# Patient Record
Sex: Male | Born: 1935
Health system: Southern US, Community
[De-identification: ages and names within clinical notes are randomized; demographics above are authoritative.]

---

## 1997-10-19 ENCOUNTER — Inpatient Hospital Stay (HOSPITAL_COMMUNITY): Admission: EM | Admit: 1997-10-19 | Discharge: 1997-10-20 | Payer: Self-pay | Admitting: Cardiology

## 2007-09-16 ENCOUNTER — Ambulatory Visit (HOSPITAL_COMMUNITY): Admission: RE | Admit: 2007-09-16 | Discharge: 2007-09-16 | Payer: Self-pay | Admitting: Optometry

## 2009-10-22 IMAGING — US IR IVC FILTER PLACEMENT
1 series · 1 of 1 positions shown · non-contrast
Comparison: none

CLINICAL DATA: DVT despite adequate anticoagulation therapy.
Chronic renal insufficiency.

[Series 1: ir ivc filter placement · 1 of 1 slices shown]
[im 1/1]
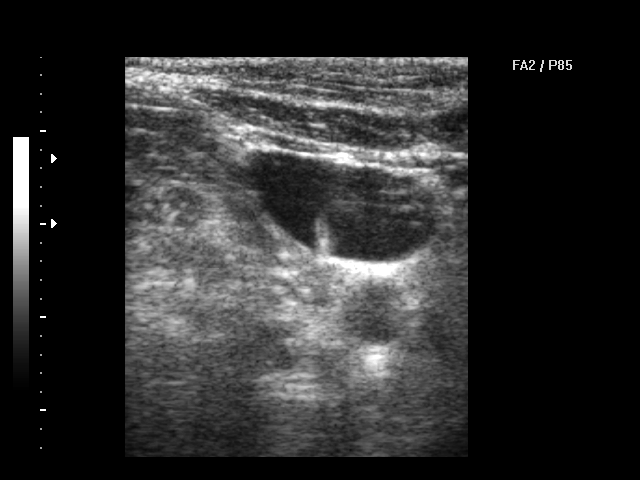

[1 of 1 positions shown; findings below may reference images not displayed]

INFERIOR VENACAVOGRAM
IVC FILTER PLACEMENT UNDER FLUOROSCOPY

Patency of the right IJ vein was confirmed with ultrasound. An
appropriate skin entry site was determined. Skin site was marked,
prepped with Betadine, and draped in usual sterile fashion, and
infiltrated locally with 1% lidocaine. Intravenous fentanyl and
Versed were administered as conscious sedation during continuous
cardiorespiratory monitoring by the radiology RN. The right IJ vein
was accessed with a 21 gauge micropuncture needle under real-time
ultrasound guidance. The needle was exchanged over a 018 guidewire
for a transitional dilator, which allow advancement of the Benson
wire into the IVC. A long 6 French vascular sheath was placed for
inferior venacavography using CO2, to avoid the potential
nephrotoxicity of iodinated contrast. This demonstrated no caval
thrombus. Renal vein inflows were evident.

The G2 IVC filter was advanced through the sheath and successfully
deployed under fluoroscopy at the L2-3 level. Followup cavagram
demonstrates stable filter position  and no evident complication.
The sheath was removed and hemostasis achieved at the site. No
immediate complication.
IMPRESSION: 1.  Normal IVC. No thrombus or significant anatomic variation.
2.  Technically successful infrarenal IVC filter placement. This is
a retrievable model.

## 2012-06-14 DIAGNOSIS — I4891 Unspecified atrial fibrillation: Secondary | ICD-10-CM

## 2015-01-15 ENCOUNTER — Ambulatory Visit (INDEPENDENT_AMBULATORY_CARE_PROVIDER_SITE_OTHER): Payer: Medicare Other | Admitting: Urology

## 2015-01-15 DIAGNOSIS — R3915 Urgency of urination: Secondary | ICD-10-CM

## 2015-01-15 DIAGNOSIS — N401 Enlarged prostate with lower urinary tract symptoms: Secondary | ICD-10-CM | POA: Diagnosis not present

## 2015-01-29 DIAGNOSIS — N183 Chronic kidney disease, stage 3 (moderate): Secondary | ICD-10-CM | POA: Diagnosis not present

## 2015-01-29 DIAGNOSIS — Z6841 Body Mass Index (BMI) 40.0 and over, adult: Secondary | ICD-10-CM | POA: Diagnosis not present

## 2015-01-29 DIAGNOSIS — Z87891 Personal history of nicotine dependence: Secondary | ICD-10-CM | POA: Diagnosis not present

## 2015-01-29 DIAGNOSIS — E1122 Type 2 diabetes mellitus with diabetic chronic kidney disease: Secondary | ICD-10-CM | POA: Diagnosis not present

## 2015-02-16 DIAGNOSIS — C44629 Squamous cell carcinoma of skin of left upper limb, including shoulder: Secondary | ICD-10-CM | POA: Diagnosis not present

## 2015-02-16 DIAGNOSIS — D485 Neoplasm of uncertain behavior of skin: Secondary | ICD-10-CM | POA: Diagnosis not present

## 2015-02-16 DIAGNOSIS — E559 Vitamin D deficiency, unspecified: Secondary | ICD-10-CM | POA: Diagnosis not present

## 2015-02-16 DIAGNOSIS — I10 Essential (primary) hypertension: Secondary | ICD-10-CM | POA: Diagnosis not present

## 2015-02-16 DIAGNOSIS — L820 Inflamed seborrheic keratosis: Secondary | ICD-10-CM | POA: Diagnosis not present

## 2015-02-16 DIAGNOSIS — L821 Other seborrheic keratosis: Secondary | ICD-10-CM | POA: Diagnosis not present

## 2015-02-16 DIAGNOSIS — N183 Chronic kidney disease, stage 3 (moderate): Secondary | ICD-10-CM | POA: Diagnosis not present

## 2015-02-16 DIAGNOSIS — D509 Iron deficiency anemia, unspecified: Secondary | ICD-10-CM | POA: Diagnosis not present

## 2015-02-16 DIAGNOSIS — Z79899 Other long term (current) drug therapy: Secondary | ICD-10-CM | POA: Diagnosis not present

## 2015-02-16 DIAGNOSIS — R809 Proteinuria, unspecified: Secondary | ICD-10-CM | POA: Diagnosis not present

## 2015-02-16 DIAGNOSIS — L570 Actinic keratosis: Secondary | ICD-10-CM | POA: Diagnosis not present

## 2015-02-23 DIAGNOSIS — I509 Heart failure, unspecified: Secondary | ICD-10-CM | POA: Diagnosis not present

## 2015-02-23 DIAGNOSIS — D649 Anemia, unspecified: Secondary | ICD-10-CM | POA: Diagnosis not present

## 2015-02-23 DIAGNOSIS — J449 Chronic obstructive pulmonary disease, unspecified: Secondary | ICD-10-CM | POA: Diagnosis not present

## 2015-02-23 DIAGNOSIS — E1129 Type 2 diabetes mellitus with other diabetic kidney complication: Secondary | ICD-10-CM | POA: Diagnosis not present

## 2015-02-23 DIAGNOSIS — N184 Chronic kidney disease, stage 4 (severe): Secondary | ICD-10-CM | POA: Diagnosis not present

## 2015-02-23 DIAGNOSIS — I10 Essential (primary) hypertension: Secondary | ICD-10-CM | POA: Diagnosis not present

## 2015-03-11 DIAGNOSIS — C44629 Squamous cell carcinoma of skin of left upper limb, including shoulder: Secondary | ICD-10-CM | POA: Diagnosis not present

## 2015-05-07 DIAGNOSIS — N183 Chronic kidney disease, stage 3 (moderate): Secondary | ICD-10-CM | POA: Diagnosis not present

## 2015-05-07 DIAGNOSIS — E785 Hyperlipidemia, unspecified: Secondary | ICD-10-CM | POA: Diagnosis not present

## 2015-05-07 DIAGNOSIS — E1122 Type 2 diabetes mellitus with diabetic chronic kidney disease: Secondary | ICD-10-CM | POA: Diagnosis not present

## 2015-05-12 DIAGNOSIS — J449 Chronic obstructive pulmonary disease, unspecified: Secondary | ICD-10-CM | POA: Diagnosis not present

## 2015-05-12 DIAGNOSIS — E119 Type 2 diabetes mellitus without complications: Secondary | ICD-10-CM | POA: Diagnosis not present

## 2015-05-12 DIAGNOSIS — I4891 Unspecified atrial fibrillation: Secondary | ICD-10-CM | POA: Diagnosis not present

## 2015-05-28 DIAGNOSIS — N183 Chronic kidney disease, stage 3 (moderate): Secondary | ICD-10-CM | POA: Diagnosis not present

## 2015-05-28 DIAGNOSIS — R809 Proteinuria, unspecified: Secondary | ICD-10-CM | POA: Diagnosis not present

## 2015-05-28 DIAGNOSIS — I10 Essential (primary) hypertension: Secondary | ICD-10-CM | POA: Diagnosis not present

## 2015-05-28 DIAGNOSIS — E559 Vitamin D deficiency, unspecified: Secondary | ICD-10-CM | POA: Diagnosis not present

## 2015-05-28 DIAGNOSIS — Z79899 Other long term (current) drug therapy: Secondary | ICD-10-CM | POA: Diagnosis not present

## 2015-05-28 DIAGNOSIS — D509 Iron deficiency anemia, unspecified: Secondary | ICD-10-CM | POA: Diagnosis not present

## 2015-05-31 DIAGNOSIS — E119 Type 2 diabetes mellitus without complications: Secondary | ICD-10-CM | POA: Diagnosis not present

## 2015-05-31 DIAGNOSIS — J449 Chronic obstructive pulmonary disease, unspecified: Secondary | ICD-10-CM | POA: Diagnosis not present

## 2015-05-31 DIAGNOSIS — I4891 Unspecified atrial fibrillation: Secondary | ICD-10-CM | POA: Diagnosis not present

## 2015-06-21 DIAGNOSIS — R3915 Urgency of urination: Secondary | ICD-10-CM | POA: Diagnosis not present

## 2015-06-21 DIAGNOSIS — R790 Abnormal level of blood mineral: Secondary | ICD-10-CM | POA: Diagnosis not present

## 2015-06-21 DIAGNOSIS — N289 Disorder of kidney and ureter, unspecified: Secondary | ICD-10-CM | POA: Diagnosis not present

## 2015-06-21 DIAGNOSIS — R768 Other specified abnormal immunological findings in serum: Secondary | ICD-10-CM | POA: Diagnosis not present

## 2015-06-21 DIAGNOSIS — R351 Nocturia: Secondary | ICD-10-CM | POA: Diagnosis not present

## 2015-07-02 DIAGNOSIS — E785 Hyperlipidemia, unspecified: Secondary | ICD-10-CM | POA: Diagnosis not present

## 2015-07-02 DIAGNOSIS — E1122 Type 2 diabetes mellitus with diabetic chronic kidney disease: Secondary | ICD-10-CM | POA: Diagnosis not present

## 2015-07-02 DIAGNOSIS — R5383 Other fatigue: Secondary | ICD-10-CM | POA: Diagnosis not present

## 2015-07-02 DIAGNOSIS — Z125 Encounter for screening for malignant neoplasm of prostate: Secondary | ICD-10-CM | POA: Diagnosis not present

## 2015-07-05 DIAGNOSIS — R3915 Urgency of urination: Secondary | ICD-10-CM | POA: Diagnosis not present

## 2015-07-05 DIAGNOSIS — R39198 Other difficulties with micturition: Secondary | ICD-10-CM | POA: Diagnosis not present

## 2015-07-05 DIAGNOSIS — N3943 Post-void dribbling: Secondary | ICD-10-CM | POA: Diagnosis not present

## 2015-07-05 DIAGNOSIS — N401 Enlarged prostate with lower urinary tract symptoms: Secondary | ICD-10-CM | POA: Diagnosis not present

## 2015-07-06 DIAGNOSIS — E1129 Type 2 diabetes mellitus with other diabetic kidney complication: Secondary | ICD-10-CM | POA: Diagnosis not present

## 2015-07-06 DIAGNOSIS — I10 Essential (primary) hypertension: Secondary | ICD-10-CM | POA: Diagnosis not present

## 2015-07-06 DIAGNOSIS — N184 Chronic kidney disease, stage 4 (severe): Secondary | ICD-10-CM | POA: Diagnosis not present

## 2015-07-09 DIAGNOSIS — E119 Type 2 diabetes mellitus without complications: Secondary | ICD-10-CM | POA: Diagnosis not present

## 2015-07-09 DIAGNOSIS — I4891 Unspecified atrial fibrillation: Secondary | ICD-10-CM | POA: Diagnosis not present

## 2015-07-09 DIAGNOSIS — J449 Chronic obstructive pulmonary disease, unspecified: Secondary | ICD-10-CM | POA: Diagnosis not present

## 2015-08-13 DIAGNOSIS — E1122 Type 2 diabetes mellitus with diabetic chronic kidney disease: Secondary | ICD-10-CM | POA: Diagnosis not present

## 2015-08-13 DIAGNOSIS — I639 Cerebral infarction, unspecified: Secondary | ICD-10-CM | POA: Diagnosis not present

## 2015-08-13 DIAGNOSIS — I10 Essential (primary) hypertension: Secondary | ICD-10-CM | POA: Diagnosis not present

## 2015-08-13 DIAGNOSIS — I4891 Unspecified atrial fibrillation: Secondary | ICD-10-CM | POA: Diagnosis not present

## 2015-09-01 DIAGNOSIS — Z85828 Personal history of other malignant neoplasm of skin: Secondary | ICD-10-CM | POA: Diagnosis not present

## 2015-09-01 DIAGNOSIS — L570 Actinic keratosis: Secondary | ICD-10-CM | POA: Diagnosis not present

## 2015-09-01 DIAGNOSIS — L821 Other seborrheic keratosis: Secondary | ICD-10-CM | POA: Diagnosis not present

## 2015-09-07 DIAGNOSIS — R39198 Other difficulties with micturition: Secondary | ICD-10-CM | POA: Diagnosis not present

## 2015-09-07 DIAGNOSIS — R3915 Urgency of urination: Secondary | ICD-10-CM | POA: Diagnosis not present

## 2015-09-07 DIAGNOSIS — B372 Candidiasis of skin and nail: Secondary | ICD-10-CM | POA: Diagnosis not present

## 2015-10-19 DIAGNOSIS — R3915 Urgency of urination: Secondary | ICD-10-CM | POA: Diagnosis not present

## 2015-10-19 DIAGNOSIS — R351 Nocturia: Secondary | ICD-10-CM | POA: Diagnosis not present

## 2015-10-19 DIAGNOSIS — B372 Candidiasis of skin and nail: Secondary | ICD-10-CM | POA: Diagnosis not present

## 2015-10-28 DIAGNOSIS — E559 Vitamin D deficiency, unspecified: Secondary | ICD-10-CM | POA: Diagnosis not present

## 2015-10-28 DIAGNOSIS — R809 Proteinuria, unspecified: Secondary | ICD-10-CM | POA: Diagnosis not present

## 2015-10-28 DIAGNOSIS — D509 Iron deficiency anemia, unspecified: Secondary | ICD-10-CM | POA: Diagnosis not present

## 2015-10-28 DIAGNOSIS — N183 Chronic kidney disease, stage 3 (moderate): Secondary | ICD-10-CM | POA: Diagnosis not present

## 2015-10-28 DIAGNOSIS — Z79899 Other long term (current) drug therapy: Secondary | ICD-10-CM | POA: Diagnosis not present

## 2015-10-28 DIAGNOSIS — I10 Essential (primary) hypertension: Secondary | ICD-10-CM | POA: Diagnosis not present

## 2015-11-02 DIAGNOSIS — I10 Essential (primary) hypertension: Secondary | ICD-10-CM | POA: Diagnosis not present

## 2015-11-02 DIAGNOSIS — I509 Heart failure, unspecified: Secondary | ICD-10-CM | POA: Diagnosis not present

## 2015-11-02 DIAGNOSIS — R809 Proteinuria, unspecified: Secondary | ICD-10-CM | POA: Diagnosis not present

## 2015-11-02 DIAGNOSIS — N184 Chronic kidney disease, stage 4 (severe): Secondary | ICD-10-CM | POA: Diagnosis not present

## 2015-11-02 DIAGNOSIS — J449 Chronic obstructive pulmonary disease, unspecified: Secondary | ICD-10-CM | POA: Diagnosis not present

## 2015-11-02 DIAGNOSIS — D649 Anemia, unspecified: Secondary | ICD-10-CM | POA: Diagnosis not present

## 2015-11-02 DIAGNOSIS — E1129 Type 2 diabetes mellitus with other diabetic kidney complication: Secondary | ICD-10-CM | POA: Diagnosis not present

## 2015-11-12 DIAGNOSIS — Z299 Encounter for prophylactic measures, unspecified: Secondary | ICD-10-CM | POA: Diagnosis not present

## 2015-11-12 DIAGNOSIS — Z1211 Encounter for screening for malignant neoplasm of colon: Secondary | ICD-10-CM | POA: Diagnosis not present

## 2015-11-12 DIAGNOSIS — F329 Major depressive disorder, single episode, unspecified: Secondary | ICD-10-CM | POA: Diagnosis not present

## 2015-11-12 DIAGNOSIS — Z0000 Encounter for general adult medical examination without abnormal findings: Secondary | ICD-10-CM | POA: Diagnosis not present

## 2015-11-12 DIAGNOSIS — Z7189 Other specified counseling: Secondary | ICD-10-CM | POA: Diagnosis not present

## 2015-11-12 DIAGNOSIS — Z6841 Body Mass Index (BMI) 40.0 and over, adult: Secondary | ICD-10-CM | POA: Diagnosis not present

## 2015-11-12 DIAGNOSIS — Z1389 Encounter for screening for other disorder: Secondary | ICD-10-CM | POA: Diagnosis not present

## 2015-11-19 DIAGNOSIS — R3915 Urgency of urination: Secondary | ICD-10-CM | POA: Diagnosis not present

## 2015-12-22 DIAGNOSIS — I4891 Unspecified atrial fibrillation: Secondary | ICD-10-CM | POA: Diagnosis not present

## 2015-12-22 DIAGNOSIS — J449 Chronic obstructive pulmonary disease, unspecified: Secondary | ICD-10-CM | POA: Diagnosis not present

## 2015-12-22 DIAGNOSIS — E119 Type 2 diabetes mellitus without complications: Secondary | ICD-10-CM | POA: Diagnosis not present

## 2016-01-27 DIAGNOSIS — E785 Hyperlipidemia, unspecified: Secondary | ICD-10-CM | POA: Diagnosis not present

## 2016-01-27 DIAGNOSIS — R5383 Other fatigue: Secondary | ICD-10-CM | POA: Diagnosis not present

## 2016-01-27 DIAGNOSIS — Z79899 Other long term (current) drug therapy: Secondary | ICD-10-CM | POA: Diagnosis not present

## 2016-01-28 DIAGNOSIS — I4891 Unspecified atrial fibrillation: Secondary | ICD-10-CM | POA: Diagnosis not present

## 2016-01-28 DIAGNOSIS — J449 Chronic obstructive pulmonary disease, unspecified: Secondary | ICD-10-CM | POA: Diagnosis not present

## 2016-01-28 DIAGNOSIS — Z299 Encounter for prophylactic measures, unspecified: Secondary | ICD-10-CM | POA: Diagnosis not present

## 2016-01-28 DIAGNOSIS — Z713 Dietary counseling and surveillance: Secondary | ICD-10-CM | POA: Diagnosis not present

## 2016-01-28 DIAGNOSIS — Z6841 Body Mass Index (BMI) 40.0 and over, adult: Secondary | ICD-10-CM | POA: Diagnosis not present

## 2016-01-28 DIAGNOSIS — Z87891 Personal history of nicotine dependence: Secondary | ICD-10-CM | POA: Diagnosis not present

## 2016-01-28 DIAGNOSIS — I639 Cerebral infarction, unspecified: Secondary | ICD-10-CM | POA: Diagnosis not present

## 2016-01-28 DIAGNOSIS — E1122 Type 2 diabetes mellitus with diabetic chronic kidney disease: Secondary | ICD-10-CM | POA: Diagnosis not present

## 2016-01-28 DIAGNOSIS — N184 Chronic kidney disease, stage 4 (severe): Secondary | ICD-10-CM | POA: Diagnosis not present

## 2016-02-14 DIAGNOSIS — I4891 Unspecified atrial fibrillation: Secondary | ICD-10-CM | POA: Diagnosis not present

## 2016-02-14 DIAGNOSIS — E119 Type 2 diabetes mellitus without complications: Secondary | ICD-10-CM | POA: Diagnosis not present

## 2016-02-14 DIAGNOSIS — J449 Chronic obstructive pulmonary disease, unspecified: Secondary | ICD-10-CM | POA: Diagnosis not present

## 2016-03-03 DIAGNOSIS — E119 Type 2 diabetes mellitus without complications: Secondary | ICD-10-CM | POA: Diagnosis not present

## 2016-03-03 DIAGNOSIS — I4891 Unspecified atrial fibrillation: Secondary | ICD-10-CM | POA: Diagnosis not present

## 2016-03-03 DIAGNOSIS — J449 Chronic obstructive pulmonary disease, unspecified: Secondary | ICD-10-CM | POA: Diagnosis not present

## 2016-03-06 DIAGNOSIS — Z85828 Personal history of other malignant neoplasm of skin: Secondary | ICD-10-CM | POA: Diagnosis not present

## 2016-03-06 DIAGNOSIS — L570 Actinic keratosis: Secondary | ICD-10-CM | POA: Diagnosis not present

## 2016-03-31 DIAGNOSIS — J449 Chronic obstructive pulmonary disease, unspecified: Secondary | ICD-10-CM | POA: Diagnosis not present

## 2016-03-31 DIAGNOSIS — E119 Type 2 diabetes mellitus without complications: Secondary | ICD-10-CM | POA: Diagnosis not present

## 2016-03-31 DIAGNOSIS — I4891 Unspecified atrial fibrillation: Secondary | ICD-10-CM | POA: Diagnosis not present

## 2016-04-27 DIAGNOSIS — R809 Proteinuria, unspecified: Secondary | ICD-10-CM | POA: Diagnosis not present

## 2016-04-27 DIAGNOSIS — N183 Chronic kidney disease, stage 3 (moderate): Secondary | ICD-10-CM | POA: Diagnosis not present

## 2016-04-27 DIAGNOSIS — D509 Iron deficiency anemia, unspecified: Secondary | ICD-10-CM | POA: Diagnosis not present

## 2016-04-27 DIAGNOSIS — I10 Essential (primary) hypertension: Secondary | ICD-10-CM | POA: Diagnosis not present

## 2016-04-27 DIAGNOSIS — E559 Vitamin D deficiency, unspecified: Secondary | ICD-10-CM | POA: Diagnosis not present

## 2016-04-27 DIAGNOSIS — Z79899 Other long term (current) drug therapy: Secondary | ICD-10-CM | POA: Diagnosis not present

## 2016-05-01 DIAGNOSIS — I4891 Unspecified atrial fibrillation: Secondary | ICD-10-CM | POA: Diagnosis not present

## 2016-05-01 DIAGNOSIS — E119 Type 2 diabetes mellitus without complications: Secondary | ICD-10-CM | POA: Diagnosis not present

## 2016-05-01 DIAGNOSIS — J449 Chronic obstructive pulmonary disease, unspecified: Secondary | ICD-10-CM | POA: Diagnosis not present

## 2016-05-02 DIAGNOSIS — E1129 Type 2 diabetes mellitus with other diabetic kidney complication: Secondary | ICD-10-CM | POA: Diagnosis not present

## 2016-05-02 DIAGNOSIS — D649 Anemia, unspecified: Secondary | ICD-10-CM | POA: Diagnosis not present

## 2016-05-02 DIAGNOSIS — I509 Heart failure, unspecified: Secondary | ICD-10-CM | POA: Diagnosis not present

## 2016-05-02 DIAGNOSIS — N183 Chronic kidney disease, stage 3 (moderate): Secondary | ICD-10-CM | POA: Diagnosis not present

## 2016-05-02 DIAGNOSIS — R809 Proteinuria, unspecified: Secondary | ICD-10-CM | POA: Diagnosis not present

## 2016-05-02 DIAGNOSIS — N250 Renal osteodystrophy: Secondary | ICD-10-CM | POA: Diagnosis not present

## 2016-05-02 DIAGNOSIS — I10 Essential (primary) hypertension: Secondary | ICD-10-CM | POA: Diagnosis not present

## 2016-05-05 DIAGNOSIS — Z6841 Body Mass Index (BMI) 40.0 and over, adult: Secondary | ICD-10-CM | POA: Diagnosis not present

## 2016-05-05 DIAGNOSIS — I4891 Unspecified atrial fibrillation: Secondary | ICD-10-CM | POA: Diagnosis not present

## 2016-05-05 DIAGNOSIS — N400 Enlarged prostate without lower urinary tract symptoms: Secondary | ICD-10-CM | POA: Diagnosis not present

## 2016-05-05 DIAGNOSIS — N184 Chronic kidney disease, stage 4 (severe): Secondary | ICD-10-CM | POA: Diagnosis not present

## 2016-05-05 DIAGNOSIS — Z87891 Personal history of nicotine dependence: Secondary | ICD-10-CM | POA: Diagnosis not present

## 2016-05-05 DIAGNOSIS — E1122 Type 2 diabetes mellitus with diabetic chronic kidney disease: Secondary | ICD-10-CM | POA: Diagnosis not present

## 2016-05-05 DIAGNOSIS — H9193 Unspecified hearing loss, bilateral: Secondary | ICD-10-CM | POA: Diagnosis not present

## 2016-05-05 DIAGNOSIS — J449 Chronic obstructive pulmonary disease, unspecified: Secondary | ICD-10-CM | POA: Diagnosis not present

## 2016-05-05 DIAGNOSIS — Z299 Encounter for prophylactic measures, unspecified: Secondary | ICD-10-CM | POA: Diagnosis not present

## 2016-05-05 DIAGNOSIS — I639 Cerebral infarction, unspecified: Secondary | ICD-10-CM | POA: Diagnosis not present

## 2016-05-05 DIAGNOSIS — I10 Essential (primary) hypertension: Secondary | ICD-10-CM | POA: Diagnosis not present

## 2016-05-18 DIAGNOSIS — E785 Hyperlipidemia, unspecified: Secondary | ICD-10-CM | POA: Diagnosis not present

## 2016-05-18 DIAGNOSIS — E1122 Type 2 diabetes mellitus with diabetic chronic kidney disease: Secondary | ICD-10-CM | POA: Diagnosis not present

## 2016-05-18 DIAGNOSIS — I639 Cerebral infarction, unspecified: Secondary | ICD-10-CM | POA: Diagnosis not present

## 2016-05-18 DIAGNOSIS — J449 Chronic obstructive pulmonary disease, unspecified: Secondary | ICD-10-CM | POA: Diagnosis not present

## 2016-05-18 DIAGNOSIS — N400 Enlarged prostate without lower urinary tract symptoms: Secondary | ICD-10-CM | POA: Diagnosis not present

## 2016-05-18 DIAGNOSIS — Z299 Encounter for prophylactic measures, unspecified: Secondary | ICD-10-CM | POA: Diagnosis not present

## 2016-05-18 DIAGNOSIS — M109 Gout, unspecified: Secondary | ICD-10-CM | POA: Diagnosis not present

## 2016-05-18 DIAGNOSIS — H9193 Unspecified hearing loss, bilateral: Secondary | ICD-10-CM | POA: Diagnosis not present

## 2016-05-18 DIAGNOSIS — Z6841 Body Mass Index (BMI) 40.0 and over, adult: Secondary | ICD-10-CM | POA: Diagnosis not present

## 2016-05-18 DIAGNOSIS — N184 Chronic kidney disease, stage 4 (severe): Secondary | ICD-10-CM | POA: Diagnosis not present

## 2016-05-18 DIAGNOSIS — I4891 Unspecified atrial fibrillation: Secondary | ICD-10-CM | POA: Diagnosis not present

## 2016-08-14 DIAGNOSIS — F419 Anxiety disorder, unspecified: Secondary | ICD-10-CM | POA: Diagnosis not present

## 2016-08-14 DIAGNOSIS — N400 Enlarged prostate without lower urinary tract symptoms: Secondary | ICD-10-CM | POA: Diagnosis not present

## 2016-08-14 DIAGNOSIS — I10 Essential (primary) hypertension: Secondary | ICD-10-CM | POA: Diagnosis not present

## 2016-08-14 DIAGNOSIS — L309 Dermatitis, unspecified: Secondary | ICD-10-CM | POA: Diagnosis not present

## 2016-08-14 DIAGNOSIS — Z299 Encounter for prophylactic measures, unspecified: Secondary | ICD-10-CM | POA: Diagnosis not present

## 2016-08-14 DIAGNOSIS — F329 Major depressive disorder, single episode, unspecified: Secondary | ICD-10-CM | POA: Diagnosis not present

## 2016-08-14 DIAGNOSIS — J449 Chronic obstructive pulmonary disease, unspecified: Secondary | ICD-10-CM | POA: Diagnosis not present

## 2016-08-14 DIAGNOSIS — N184 Chronic kidney disease, stage 4 (severe): Secondary | ICD-10-CM | POA: Diagnosis not present

## 2016-08-14 DIAGNOSIS — E785 Hyperlipidemia, unspecified: Secondary | ICD-10-CM | POA: Diagnosis not present

## 2016-08-14 DIAGNOSIS — Z6841 Body Mass Index (BMI) 40.0 and over, adult: Secondary | ICD-10-CM | POA: Diagnosis not present

## 2016-08-14 DIAGNOSIS — I4891 Unspecified atrial fibrillation: Secondary | ICD-10-CM | POA: Diagnosis not present

## 2016-08-14 DIAGNOSIS — E1122 Type 2 diabetes mellitus with diabetic chronic kidney disease: Secondary | ICD-10-CM | POA: Diagnosis not present

## 2016-08-21 DIAGNOSIS — N183 Chronic kidney disease, stage 3 (moderate): Secondary | ICD-10-CM | POA: Diagnosis not present

## 2016-08-21 DIAGNOSIS — D509 Iron deficiency anemia, unspecified: Secondary | ICD-10-CM | POA: Diagnosis not present

## 2016-08-21 DIAGNOSIS — I10 Essential (primary) hypertension: Secondary | ICD-10-CM | POA: Diagnosis not present

## 2016-08-21 DIAGNOSIS — E559 Vitamin D deficiency, unspecified: Secondary | ICD-10-CM | POA: Diagnosis not present

## 2016-08-21 DIAGNOSIS — R809 Proteinuria, unspecified: Secondary | ICD-10-CM | POA: Diagnosis not present

## 2016-08-21 DIAGNOSIS — Z79899 Other long term (current) drug therapy: Secondary | ICD-10-CM | POA: Diagnosis not present

## 2016-08-22 DIAGNOSIS — E875 Hyperkalemia: Secondary | ICD-10-CM | POA: Diagnosis not present

## 2016-08-22 DIAGNOSIS — E1129 Type 2 diabetes mellitus with other diabetic kidney complication: Secondary | ICD-10-CM | POA: Diagnosis not present

## 2016-08-22 DIAGNOSIS — N250 Renal osteodystrophy: Secondary | ICD-10-CM | POA: Diagnosis not present

## 2016-08-22 DIAGNOSIS — R809 Proteinuria, unspecified: Secondary | ICD-10-CM | POA: Diagnosis not present

## 2016-08-22 DIAGNOSIS — I10 Essential (primary) hypertension: Secondary | ICD-10-CM | POA: Diagnosis not present

## 2016-08-22 DIAGNOSIS — N183 Chronic kidney disease, stage 3 (moderate): Secondary | ICD-10-CM | POA: Diagnosis not present

## 2016-09-04 DIAGNOSIS — Z85828 Personal history of other malignant neoplasm of skin: Secondary | ICD-10-CM | POA: Diagnosis not present

## 2016-09-04 DIAGNOSIS — L570 Actinic keratosis: Secondary | ICD-10-CM | POA: Diagnosis not present

## 2016-09-04 DIAGNOSIS — L219 Seborrheic dermatitis, unspecified: Secondary | ICD-10-CM | POA: Diagnosis not present

## 2016-11-08 DIAGNOSIS — J449 Chronic obstructive pulmonary disease, unspecified: Secondary | ICD-10-CM | POA: Diagnosis not present

## 2016-11-08 DIAGNOSIS — I4891 Unspecified atrial fibrillation: Secondary | ICD-10-CM | POA: Diagnosis not present

## 2016-11-08 DIAGNOSIS — E119 Type 2 diabetes mellitus without complications: Secondary | ICD-10-CM | POA: Diagnosis not present

## 2016-11-21 DIAGNOSIS — I639 Cerebral infarction, unspecified: Secondary | ICD-10-CM | POA: Diagnosis not present

## 2016-11-21 DIAGNOSIS — N184 Chronic kidney disease, stage 4 (severe): Secondary | ICD-10-CM | POA: Diagnosis not present

## 2016-11-21 DIAGNOSIS — Z299 Encounter for prophylactic measures, unspecified: Secondary | ICD-10-CM | POA: Diagnosis not present

## 2016-11-21 DIAGNOSIS — E1165 Type 2 diabetes mellitus with hyperglycemia: Secondary | ICD-10-CM | POA: Diagnosis not present

## 2016-11-21 DIAGNOSIS — E1122 Type 2 diabetes mellitus with diabetic chronic kidney disease: Secondary | ICD-10-CM | POA: Diagnosis not present

## 2016-11-21 DIAGNOSIS — Z2821 Immunization not carried out because of patient refusal: Secondary | ICD-10-CM | POA: Diagnosis not present

## 2016-11-21 DIAGNOSIS — I4891 Unspecified atrial fibrillation: Secondary | ICD-10-CM | POA: Diagnosis not present

## 2016-11-21 DIAGNOSIS — Z6841 Body Mass Index (BMI) 40.0 and over, adult: Secondary | ICD-10-CM | POA: Diagnosis not present

## 2016-11-27 DIAGNOSIS — I4891 Unspecified atrial fibrillation: Secondary | ICD-10-CM | POA: Diagnosis not present

## 2016-11-27 DIAGNOSIS — E119 Type 2 diabetes mellitus without complications: Secondary | ICD-10-CM | POA: Diagnosis not present

## 2016-11-27 DIAGNOSIS — J449 Chronic obstructive pulmonary disease, unspecified: Secondary | ICD-10-CM | POA: Diagnosis not present

## 2016-12-12 DIAGNOSIS — R5383 Other fatigue: Secondary | ICD-10-CM | POA: Diagnosis not present

## 2016-12-12 DIAGNOSIS — Z6841 Body Mass Index (BMI) 40.0 and over, adult: Secondary | ICD-10-CM | POA: Diagnosis not present

## 2016-12-12 DIAGNOSIS — Z0000 Encounter for general adult medical examination without abnormal findings: Secondary | ICD-10-CM | POA: Diagnosis not present

## 2016-12-12 DIAGNOSIS — Z299 Encounter for prophylactic measures, unspecified: Secondary | ICD-10-CM | POA: Diagnosis not present

## 2016-12-12 DIAGNOSIS — Z7189 Other specified counseling: Secondary | ICD-10-CM | POA: Diagnosis not present

## 2016-12-12 DIAGNOSIS — Z2821 Immunization not carried out because of patient refusal: Secondary | ICD-10-CM | POA: Diagnosis not present

## 2016-12-12 DIAGNOSIS — Z1331 Encounter for screening for depression: Secondary | ICD-10-CM | POA: Diagnosis not present

## 2016-12-12 DIAGNOSIS — Z1339 Encounter for screening examination for other mental health and behavioral disorders: Secondary | ICD-10-CM | POA: Diagnosis not present

## 2016-12-12 DIAGNOSIS — Z125 Encounter for screening for malignant neoplasm of prostate: Secondary | ICD-10-CM | POA: Diagnosis not present

## 2016-12-12 DIAGNOSIS — E785 Hyperlipidemia, unspecified: Secondary | ICD-10-CM | POA: Diagnosis not present

## 2016-12-12 DIAGNOSIS — Z79899 Other long term (current) drug therapy: Secondary | ICD-10-CM | POA: Diagnosis not present

## 2017-01-01 DIAGNOSIS — I4891 Unspecified atrial fibrillation: Secondary | ICD-10-CM | POA: Diagnosis not present

## 2017-01-01 DIAGNOSIS — J449 Chronic obstructive pulmonary disease, unspecified: Secondary | ICD-10-CM | POA: Diagnosis not present

## 2017-01-01 DIAGNOSIS — E119 Type 2 diabetes mellitus without complications: Secondary | ICD-10-CM | POA: Diagnosis not present

## 2017-02-07 DIAGNOSIS — E119 Type 2 diabetes mellitus without complications: Secondary | ICD-10-CM | POA: Diagnosis not present

## 2017-02-07 DIAGNOSIS — I4891 Unspecified atrial fibrillation: Secondary | ICD-10-CM | POA: Diagnosis not present

## 2017-02-07 DIAGNOSIS — J449 Chronic obstructive pulmonary disease, unspecified: Secondary | ICD-10-CM | POA: Diagnosis not present

## 2017-02-15 DIAGNOSIS — E559 Vitamin D deficiency, unspecified: Secondary | ICD-10-CM | POA: Diagnosis not present

## 2017-02-15 DIAGNOSIS — R809 Proteinuria, unspecified: Secondary | ICD-10-CM | POA: Diagnosis not present

## 2017-02-15 DIAGNOSIS — Z79899 Other long term (current) drug therapy: Secondary | ICD-10-CM | POA: Diagnosis not present

## 2017-02-15 DIAGNOSIS — N183 Chronic kidney disease, stage 3 (moderate): Secondary | ICD-10-CM | POA: Diagnosis not present

## 2017-02-15 DIAGNOSIS — I129 Hypertensive chronic kidney disease with stage 1 through stage 4 chronic kidney disease, or unspecified chronic kidney disease: Secondary | ICD-10-CM | POA: Diagnosis not present

## 2017-02-15 DIAGNOSIS — D509 Iron deficiency anemia, unspecified: Secondary | ICD-10-CM | POA: Diagnosis not present

## 2017-02-20 DIAGNOSIS — E875 Hyperkalemia: Secondary | ICD-10-CM | POA: Diagnosis not present

## 2017-02-20 DIAGNOSIS — I10 Essential (primary) hypertension: Secondary | ICD-10-CM | POA: Diagnosis not present

## 2017-02-20 DIAGNOSIS — J449 Chronic obstructive pulmonary disease, unspecified: Secondary | ICD-10-CM | POA: Diagnosis not present

## 2017-02-20 DIAGNOSIS — N184 Chronic kidney disease, stage 4 (severe): Secondary | ICD-10-CM | POA: Diagnosis not present

## 2017-02-20 DIAGNOSIS — I509 Heart failure, unspecified: Secondary | ICD-10-CM | POA: Diagnosis not present

## 2017-02-20 DIAGNOSIS — R809 Proteinuria, unspecified: Secondary | ICD-10-CM | POA: Diagnosis not present

## 2017-02-20 DIAGNOSIS — E1129 Type 2 diabetes mellitus with other diabetic kidney complication: Secondary | ICD-10-CM | POA: Diagnosis not present

## 2017-03-06 DIAGNOSIS — L570 Actinic keratosis: Secondary | ICD-10-CM | POA: Diagnosis not present

## 2017-03-16 DIAGNOSIS — E1122 Type 2 diabetes mellitus with diabetic chronic kidney disease: Secondary | ICD-10-CM | POA: Diagnosis not present

## 2017-03-16 DIAGNOSIS — E1165 Type 2 diabetes mellitus with hyperglycemia: Secondary | ICD-10-CM | POA: Diagnosis not present

## 2017-03-16 DIAGNOSIS — Z6841 Body Mass Index (BMI) 40.0 and over, adult: Secondary | ICD-10-CM | POA: Diagnosis not present

## 2017-03-16 DIAGNOSIS — I10 Essential (primary) hypertension: Secondary | ICD-10-CM | POA: Diagnosis not present

## 2017-03-16 DIAGNOSIS — I639 Cerebral infarction, unspecified: Secondary | ICD-10-CM | POA: Diagnosis not present

## 2017-03-16 DIAGNOSIS — J449 Chronic obstructive pulmonary disease, unspecified: Secondary | ICD-10-CM | POA: Diagnosis not present

## 2017-03-16 DIAGNOSIS — Z299 Encounter for prophylactic measures, unspecified: Secondary | ICD-10-CM | POA: Diagnosis not present

## 2017-03-16 DIAGNOSIS — Z87891 Personal history of nicotine dependence: Secondary | ICD-10-CM | POA: Diagnosis not present

## 2017-05-08 DIAGNOSIS — I4891 Unspecified atrial fibrillation: Secondary | ICD-10-CM | POA: Diagnosis not present

## 2017-05-08 DIAGNOSIS — E119 Type 2 diabetes mellitus without complications: Secondary | ICD-10-CM | POA: Diagnosis not present

## 2017-05-08 DIAGNOSIS — J449 Chronic obstructive pulmonary disease, unspecified: Secondary | ICD-10-CM | POA: Diagnosis not present

## 2017-06-18 DIAGNOSIS — N184 Chronic kidney disease, stage 4 (severe): Secondary | ICD-10-CM | POA: Diagnosis not present

## 2017-06-19 DIAGNOSIS — N184 Chronic kidney disease, stage 4 (severe): Secondary | ICD-10-CM | POA: Diagnosis not present

## 2017-06-19 DIAGNOSIS — R809 Proteinuria, unspecified: Secondary | ICD-10-CM | POA: Diagnosis not present

## 2017-06-19 DIAGNOSIS — D649 Anemia, unspecified: Secondary | ICD-10-CM | POA: Diagnosis not present

## 2017-06-19 DIAGNOSIS — E1129 Type 2 diabetes mellitus with other diabetic kidney complication: Secondary | ICD-10-CM | POA: Diagnosis not present

## 2017-06-19 DIAGNOSIS — E875 Hyperkalemia: Secondary | ICD-10-CM | POA: Diagnosis not present

## 2017-06-19 DIAGNOSIS — I10 Essential (primary) hypertension: Secondary | ICD-10-CM | POA: Diagnosis not present

## 2017-06-20 DIAGNOSIS — E1165 Type 2 diabetes mellitus with hyperglycemia: Secondary | ICD-10-CM | POA: Diagnosis not present

## 2017-06-20 DIAGNOSIS — I4891 Unspecified atrial fibrillation: Secondary | ICD-10-CM | POA: Diagnosis not present

## 2017-06-20 DIAGNOSIS — E1122 Type 2 diabetes mellitus with diabetic chronic kidney disease: Secondary | ICD-10-CM | POA: Diagnosis not present

## 2017-06-20 DIAGNOSIS — J449 Chronic obstructive pulmonary disease, unspecified: Secondary | ICD-10-CM | POA: Diagnosis not present

## 2017-06-20 DIAGNOSIS — Z299 Encounter for prophylactic measures, unspecified: Secondary | ICD-10-CM | POA: Diagnosis not present

## 2017-06-20 DIAGNOSIS — N184 Chronic kidney disease, stage 4 (severe): Secondary | ICD-10-CM | POA: Diagnosis not present

## 2017-06-20 DIAGNOSIS — I10 Essential (primary) hypertension: Secondary | ICD-10-CM | POA: Diagnosis not present

## 2017-06-20 DIAGNOSIS — Z6841 Body Mass Index (BMI) 40.0 and over, adult: Secondary | ICD-10-CM | POA: Diagnosis not present

## 2017-06-20 DIAGNOSIS — R21 Rash and other nonspecific skin eruption: Secondary | ICD-10-CM | POA: Diagnosis not present

## 2017-09-11 DIAGNOSIS — L570 Actinic keratosis: Secondary | ICD-10-CM | POA: Diagnosis not present

## 2017-09-11 DIAGNOSIS — N184 Chronic kidney disease, stage 4 (severe): Secondary | ICD-10-CM | POA: Diagnosis not present

## 2017-09-11 DIAGNOSIS — E559 Vitamin D deficiency, unspecified: Secondary | ICD-10-CM | POA: Diagnosis not present

## 2017-09-18 DIAGNOSIS — I10 Essential (primary) hypertension: Secondary | ICD-10-CM | POA: Diagnosis not present

## 2017-09-18 DIAGNOSIS — R809 Proteinuria, unspecified: Secondary | ICD-10-CM | POA: Diagnosis not present

## 2017-09-18 DIAGNOSIS — N184 Chronic kidney disease, stage 4 (severe): Secondary | ICD-10-CM | POA: Diagnosis not present

## 2017-09-18 DIAGNOSIS — E875 Hyperkalemia: Secondary | ICD-10-CM | POA: Diagnosis not present

## 2017-09-18 DIAGNOSIS — I4891 Unspecified atrial fibrillation: Secondary | ICD-10-CM | POA: Diagnosis not present

## 2017-09-18 DIAGNOSIS — I509 Heart failure, unspecified: Secondary | ICD-10-CM | POA: Diagnosis not present

## 2017-09-18 DIAGNOSIS — E1129 Type 2 diabetes mellitus with other diabetic kidney complication: Secondary | ICD-10-CM | POA: Diagnosis not present

## 2017-09-26 DIAGNOSIS — N184 Chronic kidney disease, stage 4 (severe): Secondary | ICD-10-CM | POA: Diagnosis not present

## 2017-09-26 DIAGNOSIS — Z299 Encounter for prophylactic measures, unspecified: Secondary | ICD-10-CM | POA: Diagnosis not present

## 2017-09-26 DIAGNOSIS — E1122 Type 2 diabetes mellitus with diabetic chronic kidney disease: Secondary | ICD-10-CM | POA: Diagnosis not present

## 2017-09-26 DIAGNOSIS — E1165 Type 2 diabetes mellitus with hyperglycemia: Secondary | ICD-10-CM | POA: Diagnosis not present

## 2017-09-26 DIAGNOSIS — I10 Essential (primary) hypertension: Secondary | ICD-10-CM | POA: Diagnosis not present

## 2017-09-26 DIAGNOSIS — Z6841 Body Mass Index (BMI) 40.0 and over, adult: Secondary | ICD-10-CM | POA: Diagnosis not present

## 2017-11-25 DIAGNOSIS — S8991XA Unspecified injury of right lower leg, initial encounter: Secondary | ICD-10-CM | POA: Diagnosis not present

## 2017-11-25 DIAGNOSIS — M25461 Effusion, right knee: Secondary | ICD-10-CM | POA: Diagnosis not present

## 2017-11-25 DIAGNOSIS — R930 Abnormal findings on diagnostic imaging of skull and head, not elsewhere classified: Secondary | ICD-10-CM | POA: Diagnosis not present

## 2017-11-25 DIAGNOSIS — M1711 Unilateral primary osteoarthritis, right knee: Secondary | ICD-10-CM | POA: Diagnosis not present

## 2017-11-25 DIAGNOSIS — R4182 Altered mental status, unspecified: Secondary | ICD-10-CM | POA: Diagnosis not present

## 2017-11-25 DIAGNOSIS — R5381 Other malaise: Secondary | ICD-10-CM | POA: Diagnosis not present

## 2017-11-25 DIAGNOSIS — I5022 Chronic systolic (congestive) heart failure: Secondary | ICD-10-CM | POA: Diagnosis not present

## 2017-11-25 DIAGNOSIS — R7989 Other specified abnormal findings of blood chemistry: Secondary | ICD-10-CM | POA: Diagnosis not present

## 2017-11-25 DIAGNOSIS — R296 Repeated falls: Secondary | ICD-10-CM | POA: Diagnosis not present

## 2017-11-25 DIAGNOSIS — R0902 Hypoxemia: Secondary | ICD-10-CM | POA: Diagnosis not present

## 2017-11-25 DIAGNOSIS — J320 Chronic maxillary sinusitis: Secondary | ICD-10-CM | POA: Diagnosis not present

## 2017-11-25 DIAGNOSIS — I10 Essential (primary) hypertension: Secondary | ICD-10-CM | POA: Diagnosis not present

## 2017-11-25 DIAGNOSIS — R531 Weakness: Secondary | ICD-10-CM | POA: Diagnosis not present

## 2017-11-25 DIAGNOSIS — S299XXA Unspecified injury of thorax, initial encounter: Secondary | ICD-10-CM | POA: Diagnosis not present

## 2017-11-25 DIAGNOSIS — E119 Type 2 diabetes mellitus without complications: Secondary | ICD-10-CM | POA: Diagnosis not present

## 2017-11-25 DIAGNOSIS — M25561 Pain in right knee: Secondary | ICD-10-CM | POA: Diagnosis not present

## 2017-11-25 DIAGNOSIS — W19XXXA Unspecified fall, initial encounter: Secondary | ICD-10-CM | POA: Diagnosis not present

## 2017-11-25 DIAGNOSIS — I130 Hypertensive heart and chronic kidney disease with heart failure and stage 1 through stage 4 chronic kidney disease, or unspecified chronic kidney disease: Secondary | ICD-10-CM | POA: Diagnosis not present

## 2017-11-25 DIAGNOSIS — N189 Chronic kidney disease, unspecified: Secondary | ICD-10-CM | POA: Diagnosis not present

## 2017-11-25 DIAGNOSIS — R52 Pain, unspecified: Secondary | ICD-10-CM | POA: Diagnosis not present

## 2017-11-25 DIAGNOSIS — R404 Transient alteration of awareness: Secondary | ICD-10-CM | POA: Diagnosis not present

## 2017-11-25 DIAGNOSIS — G459 Transient cerebral ischemic attack, unspecified: Secondary | ICD-10-CM | POA: Diagnosis not present

## 2017-11-25 DIAGNOSIS — R9402 Abnormal brain scan: Secondary | ICD-10-CM | POA: Diagnosis not present

## 2017-11-25 DIAGNOSIS — E1122 Type 2 diabetes mellitus with diabetic chronic kidney disease: Secondary | ICD-10-CM | POA: Diagnosis not present

## 2017-11-26 DIAGNOSIS — E1122 Type 2 diabetes mellitus with diabetic chronic kidney disease: Secondary | ICD-10-CM | POA: Diagnosis present

## 2017-11-26 DIAGNOSIS — Z7901 Long term (current) use of anticoagulants: Secondary | ICD-10-CM | POA: Diagnosis not present

## 2017-11-26 DIAGNOSIS — R531 Weakness: Secondary | ICD-10-CM | POA: Diagnosis present

## 2017-11-26 DIAGNOSIS — M109 Gout, unspecified: Secondary | ICD-10-CM | POA: Diagnosis present

## 2017-11-26 DIAGNOSIS — R296 Repeated falls: Secondary | ICD-10-CM | POA: Diagnosis not present

## 2017-11-26 DIAGNOSIS — N189 Chronic kidney disease, unspecified: Secondary | ICD-10-CM | POA: Diagnosis present

## 2017-11-26 DIAGNOSIS — I110 Hypertensive heart disease with heart failure: Secondary | ICD-10-CM | POA: Diagnosis not present

## 2017-11-26 DIAGNOSIS — Z9181 History of falling: Secondary | ICD-10-CM | POA: Diagnosis not present

## 2017-11-26 DIAGNOSIS — I5022 Chronic systolic (congestive) heart failure: Secondary | ICD-10-CM | POA: Diagnosis present

## 2017-11-26 DIAGNOSIS — Z86718 Personal history of other venous thrombosis and embolism: Secondary | ICD-10-CM | POA: Diagnosis not present

## 2017-11-26 DIAGNOSIS — F0390 Unspecified dementia without behavioral disturbance: Secondary | ICD-10-CM | POA: Diagnosis present

## 2017-11-26 DIAGNOSIS — F339 Major depressive disorder, recurrent, unspecified: Secondary | ICD-10-CM | POA: Diagnosis not present

## 2017-11-26 DIAGNOSIS — S0990XA Unspecified injury of head, initial encounter: Secondary | ICD-10-CM | POA: Diagnosis not present

## 2017-11-26 DIAGNOSIS — I130 Hypertensive heart and chronic kidney disease with heart failure and stage 1 through stage 4 chronic kidney disease, or unspecified chronic kidney disease: Secondary | ICD-10-CM | POA: Diagnosis present

## 2017-11-26 DIAGNOSIS — G459 Transient cerebral ischemic attack, unspecified: Secondary | ICD-10-CM | POA: Diagnosis present

## 2017-11-26 DIAGNOSIS — Z79899 Other long term (current) drug therapy: Secondary | ICD-10-CM | POA: Diagnosis not present

## 2017-11-26 DIAGNOSIS — Z794 Long term (current) use of insulin: Secondary | ICD-10-CM | POA: Diagnosis not present

## 2017-11-26 DIAGNOSIS — Z880 Allergy status to penicillin: Secondary | ICD-10-CM | POA: Diagnosis not present

## 2017-11-26 DIAGNOSIS — M6281 Muscle weakness (generalized): Secondary | ICD-10-CM | POA: Diagnosis not present

## 2017-11-26 DIAGNOSIS — E119 Type 2 diabetes mellitus without complications: Secondary | ICD-10-CM | POA: Diagnosis not present

## 2017-11-26 DIAGNOSIS — J449 Chronic obstructive pulmonary disease, unspecified: Secondary | ICD-10-CM | POA: Diagnosis present

## 2017-11-26 DIAGNOSIS — F411 Generalized anxiety disorder: Secondary | ICD-10-CM | POA: Diagnosis not present

## 2017-11-26 DIAGNOSIS — R930 Abnormal findings on diagnostic imaging of skull and head, not elsewhere classified: Secondary | ICD-10-CM | POA: Diagnosis present

## 2017-11-26 DIAGNOSIS — R2689 Other abnormalities of gait and mobility: Secondary | ICD-10-CM | POA: Diagnosis not present

## 2017-11-26 DIAGNOSIS — H9190 Unspecified hearing loss, unspecified ear: Secondary | ICD-10-CM | POA: Diagnosis present

## 2017-11-26 DIAGNOSIS — Z7951 Long term (current) use of inhaled steroids: Secondary | ICD-10-CM | POA: Diagnosis not present

## 2017-11-26 DIAGNOSIS — R41841 Cognitive communication deficit: Secondary | ICD-10-CM | POA: Diagnosis not present

## 2017-11-26 DIAGNOSIS — R4182 Altered mental status, unspecified: Secondary | ICD-10-CM | POA: Diagnosis not present

## 2017-11-26 DIAGNOSIS — Z23 Encounter for immunization: Secondary | ICD-10-CM | POA: Diagnosis not present

## 2017-11-26 DIAGNOSIS — R1312 Dysphagia, oropharyngeal phase: Secondary | ICD-10-CM | POA: Diagnosis not present

## 2017-11-26 DIAGNOSIS — M1711 Unilateral primary osteoarthritis, right knee: Secondary | ICD-10-CM | POA: Diagnosis present

## 2017-11-30 DIAGNOSIS — I130 Hypertensive heart and chronic kidney disease with heart failure and stage 1 through stage 4 chronic kidney disease, or unspecified chronic kidney disease: Secondary | ICD-10-CM | POA: Diagnosis present

## 2017-11-30 DIAGNOSIS — H9190 Unspecified hearing loss, unspecified ear: Secondary | ICD-10-CM | POA: Diagnosis present

## 2017-11-30 DIAGNOSIS — M109 Gout, unspecified: Secondary | ICD-10-CM | POA: Diagnosis present

## 2017-11-30 DIAGNOSIS — R41841 Cognitive communication deficit: Secondary | ICD-10-CM | POA: Diagnosis not present

## 2017-11-30 DIAGNOSIS — F0390 Unspecified dementia without behavioral disturbance: Secondary | ICD-10-CM | POA: Diagnosis present

## 2017-11-30 DIAGNOSIS — R531 Weakness: Secondary | ICD-10-CM | POA: Diagnosis present

## 2017-11-30 DIAGNOSIS — J189 Pneumonia, unspecified organism: Secondary | ICD-10-CM | POA: Diagnosis not present

## 2017-11-30 DIAGNOSIS — R0902 Hypoxemia: Secondary | ICD-10-CM | POA: Diagnosis not present

## 2017-11-30 DIAGNOSIS — N390 Urinary tract infection, site not specified: Secondary | ICD-10-CM | POA: Diagnosis not present

## 2017-11-30 DIAGNOSIS — R1312 Dysphagia, oropharyngeal phase: Secondary | ICD-10-CM | POA: Diagnosis not present

## 2017-11-30 DIAGNOSIS — F411 Generalized anxiety disorder: Secondary | ICD-10-CM | POA: Diagnosis not present

## 2017-11-30 DIAGNOSIS — Z880 Allergy status to penicillin: Secondary | ICD-10-CM | POA: Diagnosis not present

## 2017-11-30 DIAGNOSIS — Z7901 Long term (current) use of anticoagulants: Secondary | ICD-10-CM | POA: Diagnosis not present

## 2017-11-30 DIAGNOSIS — R404 Transient alteration of awareness: Secondary | ICD-10-CM | POA: Diagnosis not present

## 2017-11-30 DIAGNOSIS — I482 Chronic atrial fibrillation, unspecified: Secondary | ICD-10-CM | POA: Diagnosis present

## 2017-11-30 DIAGNOSIS — N189 Chronic kidney disease, unspecified: Secondary | ICD-10-CM | POA: Diagnosis present

## 2017-11-30 DIAGNOSIS — I5022 Chronic systolic (congestive) heart failure: Secondary | ICD-10-CM | POA: Diagnosis present

## 2017-11-30 DIAGNOSIS — J440 Chronic obstructive pulmonary disease with acute lower respiratory infection: Secondary | ICD-10-CM | POA: Diagnosis not present

## 2017-11-30 DIAGNOSIS — R0689 Other abnormalities of breathing: Secondary | ICD-10-CM | POA: Diagnosis not present

## 2017-11-30 DIAGNOSIS — E1122 Type 2 diabetes mellitus with diabetic chronic kidney disease: Secondary | ICD-10-CM | POA: Diagnosis present

## 2017-11-30 DIAGNOSIS — E119 Type 2 diabetes mellitus without complications: Secondary | ICD-10-CM | POA: Diagnosis not present

## 2017-11-30 DIAGNOSIS — N179 Acute kidney failure, unspecified: Secondary | ICD-10-CM | POA: Diagnosis present

## 2017-11-30 DIAGNOSIS — I4891 Unspecified atrial fibrillation: Secondary | ICD-10-CM | POA: Diagnosis not present

## 2017-11-30 DIAGNOSIS — R296 Repeated falls: Secondary | ICD-10-CM | POA: Diagnosis not present

## 2017-11-30 DIAGNOSIS — Z23 Encounter for immunization: Secondary | ICD-10-CM | POA: Diagnosis not present

## 2017-11-30 DIAGNOSIS — R0602 Shortness of breath: Secondary | ICD-10-CM | POA: Diagnosis not present

## 2017-11-30 DIAGNOSIS — R4182 Altered mental status, unspecified: Secondary | ICD-10-CM | POA: Diagnosis not present

## 2017-11-30 DIAGNOSIS — R2689 Other abnormalities of gait and mobility: Secondary | ICD-10-CM | POA: Diagnosis not present

## 2017-11-30 DIAGNOSIS — G9341 Metabolic encephalopathy: Secondary | ICD-10-CM | POA: Diagnosis not present

## 2017-11-30 DIAGNOSIS — Z86718 Personal history of other venous thrombosis and embolism: Secondary | ICD-10-CM | POA: Diagnosis not present

## 2017-11-30 DIAGNOSIS — G459 Transient cerebral ischemic attack, unspecified: Secondary | ICD-10-CM | POA: Diagnosis not present

## 2017-11-30 DIAGNOSIS — Z79899 Other long term (current) drug therapy: Secondary | ICD-10-CM | POA: Diagnosis not present

## 2017-11-30 DIAGNOSIS — I499 Cardiac arrhythmia, unspecified: Secondary | ICD-10-CM | POA: Diagnosis not present

## 2017-11-30 DIAGNOSIS — F339 Major depressive disorder, recurrent, unspecified: Secondary | ICD-10-CM | POA: Diagnosis not present

## 2017-11-30 DIAGNOSIS — I110 Hypertensive heart disease with heart failure: Secondary | ICD-10-CM | POA: Diagnosis not present

## 2017-11-30 DIAGNOSIS — I480 Paroxysmal atrial fibrillation: Secondary | ICD-10-CM | POA: Diagnosis not present

## 2017-11-30 DIAGNOSIS — M6281 Muscle weakness (generalized): Secondary | ICD-10-CM | POA: Diagnosis not present

## 2017-11-30 DIAGNOSIS — I2510 Atherosclerotic heart disease of native coronary artery without angina pectoris: Secondary | ICD-10-CM | POA: Diagnosis present

## 2017-11-30 DIAGNOSIS — Z8673 Personal history of transient ischemic attack (TIA), and cerebral infarction without residual deficits: Secondary | ICD-10-CM | POA: Diagnosis not present

## 2017-12-02 DIAGNOSIS — G459 Transient cerebral ischemic attack, unspecified: Secondary | ICD-10-CM | POA: Diagnosis not present

## 2017-12-02 DIAGNOSIS — M6281 Muscle weakness (generalized): Secondary | ICD-10-CM | POA: Diagnosis not present

## 2017-12-13 DIAGNOSIS — R1312 Dysphagia, oropharyngeal phase: Secondary | ICD-10-CM | POA: Diagnosis not present

## 2017-12-13 DIAGNOSIS — Z880 Allergy status to penicillin: Secondary | ICD-10-CM | POA: Diagnosis not present

## 2017-12-13 DIAGNOSIS — H9190 Unspecified hearing loss, unspecified ear: Secondary | ICD-10-CM | POA: Diagnosis present

## 2017-12-13 DIAGNOSIS — R0902 Hypoxemia: Secondary | ICD-10-CM | POA: Diagnosis not present

## 2017-12-13 DIAGNOSIS — Z8673 Personal history of transient ischemic attack (TIA), and cerebral infarction without residual deficits: Secondary | ICD-10-CM | POA: Diagnosis not present

## 2017-12-13 DIAGNOSIS — R531 Weakness: Secondary | ICD-10-CM | POA: Diagnosis present

## 2017-12-13 DIAGNOSIS — I110 Hypertensive heart disease with heart failure: Secondary | ICD-10-CM | POA: Diagnosis not present

## 2017-12-13 DIAGNOSIS — N189 Chronic kidney disease, unspecified: Secondary | ICD-10-CM | POA: Diagnosis present

## 2017-12-13 DIAGNOSIS — N390 Urinary tract infection, site not specified: Secondary | ICD-10-CM | POA: Diagnosis not present

## 2017-12-13 DIAGNOSIS — R404 Transient alteration of awareness: Secondary | ICD-10-CM | POA: Diagnosis not present

## 2017-12-13 DIAGNOSIS — Z79899 Other long term (current) drug therapy: Secondary | ICD-10-CM | POA: Diagnosis not present

## 2017-12-13 DIAGNOSIS — R2689 Other abnormalities of gait and mobility: Secondary | ICD-10-CM | POA: Diagnosis not present

## 2017-12-13 DIAGNOSIS — J440 Chronic obstructive pulmonary disease with acute lower respiratory infection: Secondary | ICD-10-CM | POA: Diagnosis present

## 2017-12-13 DIAGNOSIS — F0391 Unspecified dementia with behavioral disturbance: Secondary | ICD-10-CM | POA: Diagnosis not present

## 2017-12-13 DIAGNOSIS — I2510 Atherosclerotic heart disease of native coronary artery without angina pectoris: Secondary | ICD-10-CM | POA: Diagnosis present

## 2017-12-13 DIAGNOSIS — I5022 Chronic systolic (congestive) heart failure: Secondary | ICD-10-CM | POA: Diagnosis present

## 2017-12-13 DIAGNOSIS — Z7401 Bed confinement status: Secondary | ICD-10-CM | POA: Diagnosis not present

## 2017-12-13 DIAGNOSIS — R0689 Other abnormalities of breathing: Secondary | ICD-10-CM | POA: Diagnosis not present

## 2017-12-13 DIAGNOSIS — F339 Major depressive disorder, recurrent, unspecified: Secondary | ICD-10-CM | POA: Diagnosis not present

## 2017-12-13 DIAGNOSIS — I482 Chronic atrial fibrillation, unspecified: Secondary | ICD-10-CM | POA: Diagnosis not present

## 2017-12-13 DIAGNOSIS — J811 Chronic pulmonary edema: Secondary | ICD-10-CM | POA: Diagnosis not present

## 2017-12-13 DIAGNOSIS — M6281 Muscle weakness (generalized): Secondary | ICD-10-CM | POA: Diagnosis not present

## 2017-12-13 DIAGNOSIS — I480 Paroxysmal atrial fibrillation: Secondary | ICD-10-CM | POA: Diagnosis not present

## 2017-12-13 DIAGNOSIS — I4891 Unspecified atrial fibrillation: Secondary | ICD-10-CM | POA: Diagnosis not present

## 2017-12-13 DIAGNOSIS — R0602 Shortness of breath: Secondary | ICD-10-CM | POA: Diagnosis not present

## 2017-12-13 DIAGNOSIS — E119 Type 2 diabetes mellitus without complications: Secondary | ICD-10-CM | POA: Diagnosis not present

## 2017-12-13 DIAGNOSIS — E1122 Type 2 diabetes mellitus with diabetic chronic kidney disease: Secondary | ICD-10-CM | POA: Diagnosis present

## 2017-12-13 DIAGNOSIS — I499 Cardiac arrhythmia, unspecified: Secondary | ICD-10-CM | POA: Diagnosis not present

## 2017-12-13 DIAGNOSIS — G9341 Metabolic encephalopathy: Secondary | ICD-10-CM | POA: Diagnosis not present

## 2017-12-13 DIAGNOSIS — R05 Cough: Secondary | ICD-10-CM | POA: Diagnosis not present

## 2017-12-13 DIAGNOSIS — I130 Hypertensive heart and chronic kidney disease with heart failure and stage 1 through stage 4 chronic kidney disease, or unspecified chronic kidney disease: Secondary | ICD-10-CM | POA: Diagnosis not present

## 2017-12-13 DIAGNOSIS — Z86718 Personal history of other venous thrombosis and embolism: Secondary | ICD-10-CM | POA: Diagnosis not present

## 2017-12-13 DIAGNOSIS — F0390 Unspecified dementia without behavioral disturbance: Secondary | ICD-10-CM | POA: Diagnosis present

## 2017-12-13 DIAGNOSIS — N179 Acute kidney failure, unspecified: Secondary | ICD-10-CM | POA: Diagnosis present

## 2017-12-13 DIAGNOSIS — M109 Gout, unspecified: Secondary | ICD-10-CM | POA: Diagnosis present

## 2017-12-13 DIAGNOSIS — J449 Chronic obstructive pulmonary disease, unspecified: Secondary | ICD-10-CM | POA: Diagnosis not present

## 2017-12-13 DIAGNOSIS — Z7901 Long term (current) use of anticoagulants: Secondary | ICD-10-CM | POA: Diagnosis not present

## 2017-12-13 DIAGNOSIS — J189 Pneumonia, unspecified organism: Secondary | ICD-10-CM | POA: Diagnosis not present

## 2017-12-13 DIAGNOSIS — I509 Heart failure, unspecified: Secondary | ICD-10-CM | POA: Diagnosis not present

## 2017-12-20 DIAGNOSIS — J181 Lobar pneumonia, unspecified organism: Secondary | ICD-10-CM | POA: Diagnosis not present

## 2017-12-20 DIAGNOSIS — I4891 Unspecified atrial fibrillation: Secondary | ICD-10-CM | POA: Diagnosis not present

## 2017-12-20 DIAGNOSIS — M452 Ankylosing spondylitis of cervical region: Secondary | ICD-10-CM | POA: Diagnosis not present

## 2017-12-20 DIAGNOSIS — Z66 Do not resuscitate: Secondary | ICD-10-CM | POA: Diagnosis present

## 2017-12-20 DIAGNOSIS — M6281 Muscle weakness (generalized): Secondary | ICD-10-CM | POA: Diagnosis not present

## 2017-12-20 DIAGNOSIS — J440 Chronic obstructive pulmonary disease with acute lower respiratory infection: Secondary | ICD-10-CM | POA: Diagnosis not present

## 2017-12-20 DIAGNOSIS — Z880 Allergy status to penicillin: Secondary | ICD-10-CM | POA: Diagnosis not present

## 2017-12-20 DIAGNOSIS — W19XXXA Unspecified fall, initial encounter: Secondary | ICD-10-CM | POA: Diagnosis not present

## 2017-12-20 DIAGNOSIS — Z7901 Long term (current) use of anticoagulants: Secondary | ICD-10-CM | POA: Diagnosis not present

## 2017-12-20 DIAGNOSIS — R400 Somnolence: Secondary | ICD-10-CM | POA: Diagnosis not present

## 2017-12-20 DIAGNOSIS — R456 Violent behavior: Secondary | ICD-10-CM | POA: Diagnosis not present

## 2017-12-20 DIAGNOSIS — S0990XA Unspecified injury of head, initial encounter: Secondary | ICD-10-CM | POA: Diagnosis not present

## 2017-12-20 DIAGNOSIS — R0902 Hypoxemia: Secondary | ICD-10-CM | POA: Diagnosis not present

## 2017-12-20 DIAGNOSIS — R509 Fever, unspecified: Secondary | ICD-10-CM | POA: Diagnosis not present

## 2017-12-20 DIAGNOSIS — A419 Sepsis, unspecified organism: Secondary | ICD-10-CM | POA: Diagnosis present

## 2017-12-20 DIAGNOSIS — R05 Cough: Secondary | ICD-10-CM | POA: Diagnosis not present

## 2017-12-20 DIAGNOSIS — G9341 Metabolic encephalopathy: Secondary | ICD-10-CM | POA: Diagnosis not present

## 2017-12-20 DIAGNOSIS — E1122 Type 2 diabetes mellitus with diabetic chronic kidney disease: Secondary | ICD-10-CM | POA: Diagnosis present

## 2017-12-20 DIAGNOSIS — E119 Type 2 diabetes mellitus without complications: Secondary | ICD-10-CM | POA: Diagnosis not present

## 2017-12-20 DIAGNOSIS — J690 Pneumonitis due to inhalation of food and vomit: Secondary | ICD-10-CM | POA: Diagnosis present

## 2017-12-20 DIAGNOSIS — J329 Chronic sinusitis, unspecified: Secondary | ICD-10-CM | POA: Diagnosis not present

## 2017-12-20 DIAGNOSIS — W010XXA Fall on same level from slipping, tripping and stumbling without subsequent striking against object, initial encounter: Secondary | ICD-10-CM | POA: Diagnosis not present

## 2017-12-20 DIAGNOSIS — R1111 Vomiting without nausea: Secondary | ICD-10-CM | POA: Diagnosis not present

## 2017-12-20 DIAGNOSIS — S199XXA Unspecified injury of neck, initial encounter: Secondary | ICD-10-CM | POA: Diagnosis not present

## 2017-12-20 DIAGNOSIS — S0001XA Abrasion of scalp, initial encounter: Secondary | ICD-10-CM | POA: Diagnosis not present

## 2017-12-20 DIAGNOSIS — I482 Chronic atrial fibrillation, unspecified: Secondary | ICD-10-CM | POA: Diagnosis present

## 2017-12-20 DIAGNOSIS — I130 Hypertensive heart and chronic kidney disease with heart failure and stage 1 through stage 4 chronic kidney disease, or unspecified chronic kidney disease: Secondary | ICD-10-CM | POA: Diagnosis present

## 2017-12-20 DIAGNOSIS — I110 Hypertensive heart disease with heart failure: Secondary | ICD-10-CM | POA: Diagnosis not present

## 2017-12-20 DIAGNOSIS — I509 Heart failure, unspecified: Secondary | ICD-10-CM | POA: Diagnosis not present

## 2017-12-20 DIAGNOSIS — J9601 Acute respiratory failure with hypoxia: Secondary | ICD-10-CM | POA: Diagnosis present

## 2017-12-20 DIAGNOSIS — F0390 Unspecified dementia without behavioral disturbance: Secondary | ICD-10-CM | POA: Diagnosis present

## 2017-12-20 DIAGNOSIS — M4312 Spondylolisthesis, cervical region: Secondary | ICD-10-CM | POA: Diagnosis not present

## 2017-12-20 DIAGNOSIS — Z8673 Personal history of transient ischemic attack (TIA), and cerebral infarction without residual deficits: Secondary | ICD-10-CM | POA: Diagnosis not present

## 2017-12-20 DIAGNOSIS — R4182 Altered mental status, unspecified: Secondary | ICD-10-CM | POA: Diagnosis not present

## 2017-12-20 DIAGNOSIS — R2689 Other abnormalities of gait and mobility: Secondary | ICD-10-CM | POA: Diagnosis not present

## 2017-12-20 DIAGNOSIS — N189 Chronic kidney disease, unspecified: Secondary | ICD-10-CM | POA: Diagnosis present

## 2017-12-20 DIAGNOSIS — Z7401 Bed confinement status: Secondary | ICD-10-CM | POA: Diagnosis not present

## 2017-12-20 DIAGNOSIS — R0602 Shortness of breath: Secondary | ICD-10-CM | POA: Diagnosis not present

## 2017-12-20 DIAGNOSIS — E876 Hypokalemia: Secondary | ICD-10-CM | POA: Diagnosis not present

## 2017-12-20 DIAGNOSIS — R1312 Dysphagia, oropharyngeal phase: Secondary | ICD-10-CM | POA: Diagnosis not present

## 2017-12-20 DIAGNOSIS — F0391 Unspecified dementia with behavioral disturbance: Secondary | ICD-10-CM | POA: Diagnosis not present

## 2017-12-20 DIAGNOSIS — F339 Major depressive disorder, recurrent, unspecified: Secondary | ICD-10-CM | POA: Diagnosis not present

## 2017-12-20 DIAGNOSIS — J189 Pneumonia, unspecified organism: Secondary | ICD-10-CM | POA: Diagnosis not present

## 2017-12-20 DIAGNOSIS — R51 Headache: Secondary | ICD-10-CM | POA: Diagnosis not present

## 2017-12-20 DIAGNOSIS — N390 Urinary tract infection, site not specified: Secondary | ICD-10-CM | POA: Diagnosis not present

## 2017-12-20 DIAGNOSIS — Z79899 Other long term (current) drug therapy: Secondary | ICD-10-CM | POA: Diagnosis not present

## 2017-12-20 DIAGNOSIS — R404 Transient alteration of awareness: Secondary | ICD-10-CM | POA: Diagnosis not present

## 2017-12-20 DIAGNOSIS — Z743 Need for continuous supervision: Secondary | ICD-10-CM | POA: Diagnosis not present

## 2017-12-20 DIAGNOSIS — H9190 Unspecified hearing loss, unspecified ear: Secondary | ICD-10-CM | POA: Diagnosis present

## 2017-12-20 DIAGNOSIS — I5022 Chronic systolic (congestive) heart failure: Secondary | ICD-10-CM | POA: Diagnosis present

## 2017-12-20 DIAGNOSIS — J449 Chronic obstructive pulmonary disease, unspecified: Secondary | ICD-10-CM | POA: Diagnosis present

## 2017-12-20 DIAGNOSIS — J811 Chronic pulmonary edema: Secondary | ICD-10-CM | POA: Diagnosis not present

## 2017-12-22 DIAGNOSIS — M6281 Muscle weakness (generalized): Secondary | ICD-10-CM | POA: Diagnosis not present

## 2017-12-22 DIAGNOSIS — I482 Chronic atrial fibrillation, unspecified: Secondary | ICD-10-CM | POA: Diagnosis not present

## 2017-12-22 DIAGNOSIS — E119 Type 2 diabetes mellitus without complications: Secondary | ICD-10-CM | POA: Diagnosis not present

## 2017-12-22 DIAGNOSIS — J181 Lobar pneumonia, unspecified organism: Secondary | ICD-10-CM | POA: Diagnosis not present

## 2017-12-25 DIAGNOSIS — M6281 Muscle weakness (generalized): Secondary | ICD-10-CM | POA: Diagnosis not present

## 2017-12-25 DIAGNOSIS — I482 Chronic atrial fibrillation, unspecified: Secondary | ICD-10-CM | POA: Diagnosis not present

## 2017-12-25 DIAGNOSIS — E119 Type 2 diabetes mellitus without complications: Secondary | ICD-10-CM | POA: Diagnosis not present

## 2017-12-28 DIAGNOSIS — S0990XA Unspecified injury of head, initial encounter: Secondary | ICD-10-CM | POA: Diagnosis not present

## 2017-12-28 DIAGNOSIS — J329 Chronic sinusitis, unspecified: Secondary | ICD-10-CM | POA: Diagnosis not present

## 2017-12-28 DIAGNOSIS — W010XXA Fall on same level from slipping, tripping and stumbling without subsequent striking against object, initial encounter: Secondary | ICD-10-CM | POA: Diagnosis not present

## 2017-12-28 DIAGNOSIS — M452 Ankylosing spondylitis of cervical region: Secondary | ICD-10-CM | POA: Diagnosis not present

## 2017-12-28 DIAGNOSIS — S199XXA Unspecified injury of neck, initial encounter: Secondary | ICD-10-CM | POA: Diagnosis not present

## 2017-12-28 DIAGNOSIS — Z8673 Personal history of transient ischemic attack (TIA), and cerebral infarction without residual deficits: Secondary | ICD-10-CM | POA: Diagnosis not present

## 2017-12-28 DIAGNOSIS — M4312 Spondylolisthesis, cervical region: Secondary | ICD-10-CM | POA: Diagnosis not present

## 2017-12-28 DIAGNOSIS — S0001XA Abrasion of scalp, initial encounter: Secondary | ICD-10-CM | POA: Diagnosis not present

## 2017-12-28 DIAGNOSIS — E119 Type 2 diabetes mellitus without complications: Secondary | ICD-10-CM | POA: Diagnosis not present

## 2017-12-28 DIAGNOSIS — R51 Headache: Secondary | ICD-10-CM | POA: Diagnosis not present

## 2017-12-28 DIAGNOSIS — Z79899 Other long term (current) drug therapy: Secondary | ICD-10-CM | POA: Diagnosis not present

## 2017-12-28 DIAGNOSIS — Z7901 Long term (current) use of anticoagulants: Secondary | ICD-10-CM | POA: Diagnosis not present

## 2018-01-01 DIAGNOSIS — R4182 Altered mental status, unspecified: Secondary | ICD-10-CM | POA: Diagnosis not present

## 2018-01-12 DIAGNOSIS — A419 Sepsis, unspecified organism: Secondary | ICD-10-CM | POA: Diagnosis not present

## 2018-01-12 DIAGNOSIS — E1122 Type 2 diabetes mellitus with diabetic chronic kidney disease: Secondary | ICD-10-CM | POA: Diagnosis present

## 2018-01-12 DIAGNOSIS — R509 Fever, unspecified: Secondary | ICD-10-CM | POA: Diagnosis not present

## 2018-01-12 DIAGNOSIS — E876 Hypokalemia: Secondary | ICD-10-CM | POA: Diagnosis not present

## 2018-01-12 DIAGNOSIS — I130 Hypertensive heart and chronic kidney disease with heart failure and stage 1 through stage 4 chronic kidney disease, or unspecified chronic kidney disease: Secondary | ICD-10-CM | POA: Diagnosis present

## 2018-01-12 DIAGNOSIS — F0390 Unspecified dementia without behavioral disturbance: Secondary | ICD-10-CM | POA: Diagnosis present

## 2018-01-12 DIAGNOSIS — E119 Type 2 diabetes mellitus without complications: Secondary | ICD-10-CM | POA: Diagnosis not present

## 2018-01-12 DIAGNOSIS — R1312 Dysphagia, oropharyngeal phase: Secondary | ICD-10-CM | POA: Diagnosis not present

## 2018-01-12 DIAGNOSIS — J9601 Acute respiratory failure with hypoxia: Secondary | ICD-10-CM | POA: Diagnosis not present

## 2018-01-12 DIAGNOSIS — J441 Chronic obstructive pulmonary disease with (acute) exacerbation: Secondary | ICD-10-CM | POA: Diagnosis not present

## 2018-01-12 DIAGNOSIS — I482 Chronic atrial fibrillation, unspecified: Secondary | ICD-10-CM | POA: Diagnosis present

## 2018-01-12 DIAGNOSIS — H9190 Unspecified hearing loss, unspecified ear: Secondary | ICD-10-CM | POA: Diagnosis present

## 2018-01-12 DIAGNOSIS — F0391 Unspecified dementia with behavioral disturbance: Secondary | ICD-10-CM | POA: Diagnosis not present

## 2018-01-12 DIAGNOSIS — I5022 Chronic systolic (congestive) heart failure: Secondary | ICD-10-CM | POA: Diagnosis present

## 2018-01-12 DIAGNOSIS — R0902 Hypoxemia: Secondary | ICD-10-CM | POA: Diagnosis not present

## 2018-01-12 DIAGNOSIS — R2689 Other abnormalities of gait and mobility: Secondary | ICD-10-CM | POA: Diagnosis not present

## 2018-01-12 DIAGNOSIS — E1100 Type 2 diabetes mellitus with hyperosmolarity without nonketotic hyperglycemic-hyperosmolar coma (NKHHC): Secondary | ICD-10-CM | POA: Diagnosis not present

## 2018-01-12 DIAGNOSIS — Z743 Need for continuous supervision: Secondary | ICD-10-CM | POA: Diagnosis not present

## 2018-01-12 DIAGNOSIS — R1111 Vomiting without nausea: Secondary | ICD-10-CM | POA: Diagnosis not present

## 2018-01-12 DIAGNOSIS — J690 Pneumonitis due to inhalation of food and vomit: Secondary | ICD-10-CM | POA: Diagnosis not present

## 2018-01-12 DIAGNOSIS — F339 Major depressive disorder, recurrent, unspecified: Secondary | ICD-10-CM | POA: Diagnosis not present

## 2018-01-12 DIAGNOSIS — Z79899 Other long term (current) drug therapy: Secondary | ICD-10-CM | POA: Diagnosis not present

## 2018-01-12 DIAGNOSIS — N189 Chronic kidney disease, unspecified: Secondary | ICD-10-CM | POA: Diagnosis present

## 2018-01-12 DIAGNOSIS — A0471 Enterocolitis due to Clostridium difficile, recurrent: Secondary | ICD-10-CM | POA: Diagnosis not present

## 2018-01-12 DIAGNOSIS — J811 Chronic pulmonary edema: Secondary | ICD-10-CM | POA: Diagnosis not present

## 2018-01-12 DIAGNOSIS — I110 Hypertensive heart disease with heart failure: Secondary | ICD-10-CM | POA: Diagnosis not present

## 2018-01-12 DIAGNOSIS — M6281 Muscle weakness (generalized): Secondary | ICD-10-CM | POA: Diagnosis not present

## 2018-01-12 DIAGNOSIS — J189 Pneumonia, unspecified organism: Secondary | ICD-10-CM | POA: Diagnosis not present

## 2018-01-12 DIAGNOSIS — R0602 Shortness of breath: Secondary | ICD-10-CM | POA: Diagnosis not present

## 2018-01-12 DIAGNOSIS — J449 Chronic obstructive pulmonary disease, unspecified: Secondary | ICD-10-CM | POA: Diagnosis present

## 2018-01-12 DIAGNOSIS — Z880 Allergy status to penicillin: Secondary | ICD-10-CM | POA: Diagnosis not present

## 2018-01-12 DIAGNOSIS — Z66 Do not resuscitate: Secondary | ICD-10-CM | POA: Diagnosis present

## 2018-01-12 DIAGNOSIS — Z8673 Personal history of transient ischemic attack (TIA), and cerebral infarction without residual deficits: Secondary | ICD-10-CM | POA: Diagnosis not present

## 2018-01-12 DIAGNOSIS — I4891 Unspecified atrial fibrillation: Secondary | ICD-10-CM | POA: Diagnosis not present

## 2018-01-12 DIAGNOSIS — R69 Illness, unspecified: Secondary | ICD-10-CM | POA: Diagnosis not present

## 2018-01-13 DIAGNOSIS — F0390 Unspecified dementia without behavioral disturbance: Secondary | ICD-10-CM | POA: Diagnosis not present

## 2018-01-13 DIAGNOSIS — J9601 Acute respiratory failure with hypoxia: Secondary | ICD-10-CM | POA: Diagnosis not present

## 2018-01-13 DIAGNOSIS — I4891 Unspecified atrial fibrillation: Secondary | ICD-10-CM | POA: Diagnosis not present

## 2018-01-13 DIAGNOSIS — J189 Pneumonia, unspecified organism: Secondary | ICD-10-CM | POA: Diagnosis not present

## 2018-01-19 DIAGNOSIS — Z8701 Personal history of pneumonia (recurrent): Secondary | ICD-10-CM | POA: Diagnosis not present

## 2018-01-19 DIAGNOSIS — J690 Pneumonitis due to inhalation of food and vomit: Secondary | ICD-10-CM | POA: Diagnosis not present

## 2018-01-19 DIAGNOSIS — I482 Chronic atrial fibrillation, unspecified: Secondary | ICD-10-CM | POA: Diagnosis not present

## 2018-01-19 DIAGNOSIS — E1100 Type 2 diabetes mellitus with hyperosmolarity without nonketotic hyperglycemic-hyperosmolar coma (NKHHC): Secondary | ICD-10-CM | POA: Diagnosis not present

## 2018-01-19 DIAGNOSIS — J449 Chronic obstructive pulmonary disease, unspecified: Secondary | ICD-10-CM | POA: Diagnosis not present

## 2018-01-19 DIAGNOSIS — I110 Hypertensive heart disease with heart failure: Secondary | ICD-10-CM | POA: Diagnosis not present

## 2018-01-19 DIAGNOSIS — J9601 Acute respiratory failure with hypoxia: Secondary | ICD-10-CM | POA: Diagnosis not present

## 2018-01-19 DIAGNOSIS — F0390 Unspecified dementia without behavioral disturbance: Secondary | ICD-10-CM | POA: Diagnosis not present

## 2018-01-19 DIAGNOSIS — M6281 Muscle weakness (generalized): Secondary | ICD-10-CM | POA: Diagnosis not present

## 2018-01-19 DIAGNOSIS — F0391 Unspecified dementia with behavioral disturbance: Secondary | ICD-10-CM | POA: Diagnosis not present

## 2018-01-19 DIAGNOSIS — J189 Pneumonia, unspecified organism: Secondary | ICD-10-CM | POA: Diagnosis not present

## 2018-01-19 DIAGNOSIS — I4891 Unspecified atrial fibrillation: Secondary | ICD-10-CM | POA: Diagnosis not present

## 2018-01-19 DIAGNOSIS — I5022 Chronic systolic (congestive) heart failure: Secondary | ICD-10-CM | POA: Diagnosis not present

## 2018-01-19 DIAGNOSIS — R69 Illness, unspecified: Secondary | ICD-10-CM | POA: Diagnosis not present

## 2018-01-19 DIAGNOSIS — N189 Chronic kidney disease, unspecified: Secondary | ICD-10-CM | POA: Diagnosis not present

## 2018-01-19 DIAGNOSIS — F339 Major depressive disorder, recurrent, unspecified: Secondary | ICD-10-CM | POA: Diagnosis not present

## 2018-01-19 DIAGNOSIS — E119 Type 2 diabetes mellitus without complications: Secondary | ICD-10-CM | POA: Diagnosis not present

## 2018-01-19 DIAGNOSIS — R2689 Other abnormalities of gait and mobility: Secondary | ICD-10-CM | POA: Diagnosis not present

## 2018-01-19 DIAGNOSIS — J441 Chronic obstructive pulmonary disease with (acute) exacerbation: Secondary | ICD-10-CM | POA: Diagnosis not present

## 2018-01-19 DIAGNOSIS — A0471 Enterocolitis due to Clostridium difficile, recurrent: Secondary | ICD-10-CM | POA: Diagnosis not present

## 2018-01-19 DIAGNOSIS — R1312 Dysphagia, oropharyngeal phase: Secondary | ICD-10-CM | POA: Diagnosis not present

## 2018-01-19 DIAGNOSIS — Z8679 Personal history of other diseases of the circulatory system: Secondary | ICD-10-CM | POA: Diagnosis not present

## 2018-01-19 DIAGNOSIS — R600 Localized edema: Secondary | ICD-10-CM | POA: Diagnosis not present

## 2018-01-19 DIAGNOSIS — Z743 Need for continuous supervision: Secondary | ICD-10-CM | POA: Diagnosis not present

## 2018-01-19 DIAGNOSIS — L539 Erythematous condition, unspecified: Secondary | ICD-10-CM | POA: Diagnosis not present

## 2018-01-19 DIAGNOSIS — A419 Sepsis, unspecified organism: Secondary | ICD-10-CM | POA: Diagnosis not present

## 2018-01-20 DIAGNOSIS — R600 Localized edema: Secondary | ICD-10-CM | POA: Diagnosis not present

## 2018-01-20 DIAGNOSIS — J189 Pneumonia, unspecified organism: Secondary | ICD-10-CM | POA: Diagnosis not present

## 2018-01-20 DIAGNOSIS — Z8679 Personal history of other diseases of the circulatory system: Secondary | ICD-10-CM | POA: Diagnosis not present

## 2018-02-09 DIAGNOSIS — Z8701 Personal history of pneumonia (recurrent): Secondary | ICD-10-CM | POA: Diagnosis not present

## 2018-02-09 DIAGNOSIS — I4891 Unspecified atrial fibrillation: Secondary | ICD-10-CM | POA: Diagnosis not present

## 2018-03-01 DIAGNOSIS — L539 Erythematous condition, unspecified: Secondary | ICD-10-CM | POA: Diagnosis not present

## 2018-03-03 DIAGNOSIS — F411 Generalized anxiety disorder: Secondary | ICD-10-CM | POA: Diagnosis not present

## 2018-03-03 DIAGNOSIS — I5022 Chronic systolic (congestive) heart failure: Secondary | ICD-10-CM | POA: Diagnosis not present

## 2018-03-03 DIAGNOSIS — Z86718 Personal history of other venous thrombosis and embolism: Secondary | ICD-10-CM | POA: Diagnosis not present

## 2018-03-03 DIAGNOSIS — M1711 Unilateral primary osteoarthritis, right knee: Secondary | ICD-10-CM | POA: Diagnosis not present

## 2018-03-03 DIAGNOSIS — I130 Hypertensive heart and chronic kidney disease with heart failure and stage 1 through stage 4 chronic kidney disease, or unspecified chronic kidney disease: Secondary | ICD-10-CM | POA: Diagnosis not present

## 2018-03-03 DIAGNOSIS — H9193 Unspecified hearing loss, bilateral: Secondary | ICD-10-CM | POA: Diagnosis not present

## 2018-03-03 DIAGNOSIS — J181 Lobar pneumonia, unspecified organism: Secondary | ICD-10-CM | POA: Diagnosis not present

## 2018-03-03 DIAGNOSIS — J440 Chronic obstructive pulmonary disease with acute lower respiratory infection: Secondary | ICD-10-CM | POA: Diagnosis not present

## 2018-03-03 DIAGNOSIS — E1122 Type 2 diabetes mellitus with diabetic chronic kidney disease: Secondary | ICD-10-CM | POA: Diagnosis not present

## 2018-03-03 DIAGNOSIS — I480 Paroxysmal atrial fibrillation: Secondary | ICD-10-CM | POA: Diagnosis not present

## 2018-03-03 DIAGNOSIS — Z7901 Long term (current) use of anticoagulants: Secondary | ICD-10-CM | POA: Diagnosis not present

## 2018-03-03 DIAGNOSIS — M1A00X0 Idiopathic chronic gout, unspecified site, without tophus (tophi): Secondary | ICD-10-CM | POA: Diagnosis not present

## 2018-03-03 DIAGNOSIS — F0390 Unspecified dementia without behavioral disturbance: Secondary | ICD-10-CM | POA: Diagnosis not present

## 2018-03-03 DIAGNOSIS — N189 Chronic kidney disease, unspecified: Secondary | ICD-10-CM | POA: Diagnosis not present

## 2018-03-03 DIAGNOSIS — D631 Anemia in chronic kidney disease: Secondary | ICD-10-CM | POA: Diagnosis not present

## 2018-03-03 DIAGNOSIS — J9601 Acute respiratory failure with hypoxia: Secondary | ICD-10-CM | POA: Diagnosis not present

## 2018-03-03 DIAGNOSIS — Z9181 History of falling: Secondary | ICD-10-CM | POA: Diagnosis not present

## 2018-03-03 DIAGNOSIS — E785 Hyperlipidemia, unspecified: Secondary | ICD-10-CM | POA: Diagnosis not present

## 2018-03-04 DIAGNOSIS — I129 Hypertensive chronic kidney disease with stage 1 through stage 4 chronic kidney disease, or unspecified chronic kidney disease: Secondary | ICD-10-CM | POA: Diagnosis not present

## 2018-03-04 DIAGNOSIS — R609 Edema, unspecified: Secondary | ICD-10-CM | POA: Diagnosis not present

## 2018-03-04 DIAGNOSIS — Z86718 Personal history of other venous thrombosis and embolism: Secondary | ICD-10-CM | POA: Diagnosis not present

## 2018-03-04 DIAGNOSIS — E876 Hypokalemia: Secondary | ICD-10-CM | POA: Diagnosis not present

## 2018-03-04 DIAGNOSIS — R0602 Shortness of breath: Secondary | ICD-10-CM | POA: Diagnosis not present

## 2018-03-04 DIAGNOSIS — I130 Hypertensive heart and chronic kidney disease with heart failure and stage 1 through stage 4 chronic kidney disease, or unspecified chronic kidney disease: Secondary | ICD-10-CM | POA: Diagnosis not present

## 2018-03-04 DIAGNOSIS — Z8673 Personal history of transient ischemic attack (TIA), and cerebral infarction without residual deficits: Secondary | ICD-10-CM | POA: Diagnosis not present

## 2018-03-04 DIAGNOSIS — N179 Acute kidney failure, unspecified: Secondary | ICD-10-CM | POA: Diagnosis not present

## 2018-03-04 DIAGNOSIS — I509 Heart failure, unspecified: Secondary | ICD-10-CM | POA: Diagnosis not present

## 2018-03-04 DIAGNOSIS — Z79899 Other long term (current) drug therapy: Secondary | ICD-10-CM | POA: Diagnosis not present

## 2018-03-04 DIAGNOSIS — R0902 Hypoxemia: Secondary | ICD-10-CM | POA: Diagnosis not present

## 2018-03-04 DIAGNOSIS — I5022 Chronic systolic (congestive) heart failure: Secondary | ICD-10-CM | POA: Diagnosis not present

## 2018-03-04 DIAGNOSIS — J440 Chronic obstructive pulmonary disease with acute lower respiratory infection: Secondary | ICD-10-CM | POA: Diagnosis not present

## 2018-03-04 DIAGNOSIS — H9190 Unspecified hearing loss, unspecified ear: Secondary | ICD-10-CM | POA: Diagnosis present

## 2018-03-04 DIAGNOSIS — Z880 Allergy status to penicillin: Secondary | ICD-10-CM | POA: Diagnosis not present

## 2018-03-04 DIAGNOSIS — I5023 Acute on chronic systolic (congestive) heart failure: Secondary | ICD-10-CM | POA: Diagnosis not present

## 2018-03-04 DIAGNOSIS — J9601 Acute respiratory failure with hypoxia: Secondary | ICD-10-CM | POA: Diagnosis not present

## 2018-03-04 DIAGNOSIS — I361 Nonrheumatic tricuspid (valve) insufficiency: Secondary | ICD-10-CM | POA: Diagnosis not present

## 2018-03-04 DIAGNOSIS — I4891 Unspecified atrial fibrillation: Secondary | ICD-10-CM | POA: Diagnosis not present

## 2018-03-04 DIAGNOSIS — Z7901 Long term (current) use of anticoagulants: Secondary | ICD-10-CM | POA: Diagnosis not present

## 2018-03-04 DIAGNOSIS — E46 Unspecified protein-calorie malnutrition: Secondary | ICD-10-CM | POA: Diagnosis not present

## 2018-03-04 DIAGNOSIS — N183 Chronic kidney disease, stage 3 (moderate): Secondary | ICD-10-CM | POA: Diagnosis present

## 2018-03-04 DIAGNOSIS — Z66 Do not resuscitate: Secondary | ICD-10-CM | POA: Diagnosis not present

## 2018-03-04 DIAGNOSIS — J449 Chronic obstructive pulmonary disease, unspecified: Secondary | ICD-10-CM | POA: Diagnosis present

## 2018-03-04 DIAGNOSIS — N189 Chronic kidney disease, unspecified: Secondary | ICD-10-CM | POA: Diagnosis not present

## 2018-03-04 DIAGNOSIS — R5381 Other malaise: Secondary | ICD-10-CM | POA: Diagnosis not present

## 2018-03-04 DIAGNOSIS — E1122 Type 2 diabetes mellitus with diabetic chronic kidney disease: Secondary | ICD-10-CM | POA: Diagnosis present

## 2018-03-04 DIAGNOSIS — F0390 Unspecified dementia without behavioral disturbance: Secondary | ICD-10-CM | POA: Diagnosis present

## 2018-03-04 DIAGNOSIS — I255 Ischemic cardiomyopathy: Secondary | ICD-10-CM | POA: Diagnosis not present

## 2018-03-04 DIAGNOSIS — I5021 Acute systolic (congestive) heart failure: Secondary | ICD-10-CM | POA: Diagnosis not present

## 2018-03-04 DIAGNOSIS — J181 Lobar pneumonia, unspecified organism: Secondary | ICD-10-CM | POA: Diagnosis not present

## 2018-03-04 DIAGNOSIS — R0689 Other abnormalities of breathing: Secondary | ICD-10-CM | POA: Diagnosis not present

## 2018-03-05 DIAGNOSIS — I4891 Unspecified atrial fibrillation: Secondary | ICD-10-CM | POA: Diagnosis not present

## 2018-03-05 DIAGNOSIS — I361 Nonrheumatic tricuspid (valve) insufficiency: Secondary | ICD-10-CM | POA: Diagnosis not present

## 2018-03-14 ENCOUNTER — Inpatient Hospital Stay
Admission: AD | Admit: 2018-03-14 | Discharge: 2018-04-05 | Disposition: A | Discharge status: Skilled Nursing Facility | Payer: Self-pay | Source: Other Acute Inpatient Hospital | Attending: Internal Medicine | Admitting: Internal Medicine

## 2018-03-14 DIAGNOSIS — I130 Hypertensive heart and chronic kidney disease with heart failure and stage 1 through stage 4 chronic kidney disease, or unspecified chronic kidney disease: Secondary | ICD-10-CM | POA: Diagnosis not present

## 2018-03-14 DIAGNOSIS — I4891 Unspecified atrial fibrillation: Secondary | ICD-10-CM | POA: Diagnosis not present

## 2018-03-14 DIAGNOSIS — E1100 Type 2 diabetes mellitus with hyperosmolarity without nonketotic hyperglycemic-hyperosmolar coma (NKHHC): Secondary | ICD-10-CM | POA: Diagnosis not present

## 2018-03-14 DIAGNOSIS — J9690 Respiratory failure, unspecified, unspecified whether with hypoxia or hypercapnia: Secondary | ICD-10-CM | POA: Diagnosis not present

## 2018-03-14 DIAGNOSIS — R6510 Systemic inflammatory response syndrome (SIRS) of non-infectious origin without acute organ dysfunction: Secondary | ICD-10-CM | POA: Diagnosis not present

## 2018-03-14 DIAGNOSIS — I5022 Chronic systolic (congestive) heart failure: Secondary | ICD-10-CM | POA: Diagnosis present

## 2018-03-14 DIAGNOSIS — J441 Chronic obstructive pulmonary disease with (acute) exacerbation: Secondary | ICD-10-CM | POA: Diagnosis not present

## 2018-03-14 DIAGNOSIS — R410 Disorientation, unspecified: Secondary | ICD-10-CM | POA: Diagnosis not present

## 2018-03-14 DIAGNOSIS — J189 Pneumonia, unspecified organism: Secondary | ICD-10-CM

## 2018-03-14 DIAGNOSIS — E1122 Type 2 diabetes mellitus with diabetic chronic kidney disease: Secondary | ICD-10-CM | POA: Diagnosis present

## 2018-03-14 DIAGNOSIS — I69991 Dysphagia following unspecified cerebrovascular disease: Secondary | ICD-10-CM | POA: Diagnosis not present

## 2018-03-14 DIAGNOSIS — I10 Essential (primary) hypertension: Secondary | ICD-10-CM | POA: Diagnosis not present

## 2018-03-14 DIAGNOSIS — E785 Hyperlipidemia, unspecified: Secondary | ICD-10-CM | POA: Diagnosis not present

## 2018-03-14 DIAGNOSIS — A0471 Enterocolitis due to Clostridium difficile, recurrent: Secondary | ICD-10-CM | POA: Diagnosis not present

## 2018-03-14 DIAGNOSIS — R1310 Dysphagia, unspecified: Secondary | ICD-10-CM | POA: Diagnosis present

## 2018-03-14 DIAGNOSIS — F0390 Unspecified dementia without behavioral disturbance: Secondary | ICD-10-CM | POA: Diagnosis present

## 2018-03-14 DIAGNOSIS — N390 Urinary tract infection, site not specified: Secondary | ICD-10-CM | POA: Diagnosis not present

## 2018-03-14 DIAGNOSIS — I509 Heart failure, unspecified: Secondary | ICD-10-CM | POA: Diagnosis not present

## 2018-03-14 DIAGNOSIS — F419 Anxiety disorder, unspecified: Secondary | ICD-10-CM | POA: Diagnosis present

## 2018-03-14 DIAGNOSIS — I255 Ischemic cardiomyopathy: Secondary | ICD-10-CM | POA: Diagnosis present

## 2018-03-14 DIAGNOSIS — L89616 Pressure-induced deep tissue damage of right heel: Secondary | ICD-10-CM | POA: Diagnosis present

## 2018-03-14 DIAGNOSIS — R1312 Dysphagia, oropharyngeal phase: Secondary | ICD-10-CM | POA: Diagnosis not present

## 2018-03-14 DIAGNOSIS — K219 Gastro-esophageal reflux disease without esophagitis: Secondary | ICD-10-CM | POA: Diagnosis not present

## 2018-03-14 DIAGNOSIS — I110 Hypertensive heart disease with heart failure: Secondary | ICD-10-CM | POA: Diagnosis not present

## 2018-03-14 DIAGNOSIS — J449 Chronic obstructive pulmonary disease, unspecified: Secondary | ICD-10-CM | POA: Diagnosis present

## 2018-03-14 DIAGNOSIS — R279 Unspecified lack of coordination: Secondary | ICD-10-CM | POA: Diagnosis not present

## 2018-03-14 DIAGNOSIS — I4821 Permanent atrial fibrillation: Secondary | ICD-10-CM | POA: Diagnosis not present

## 2018-03-14 DIAGNOSIS — I482 Chronic atrial fibrillation, unspecified: Secondary | ICD-10-CM | POA: Diagnosis present

## 2018-03-14 DIAGNOSIS — R609 Edema, unspecified: Secondary | ICD-10-CM

## 2018-03-14 DIAGNOSIS — R208 Other disturbances of skin sensation: Secondary | ICD-10-CM

## 2018-03-14 DIAGNOSIS — I5023 Acute on chronic systolic (congestive) heart failure: Secondary | ICD-10-CM | POA: Diagnosis not present

## 2018-03-14 DIAGNOSIS — R531 Weakness: Secondary | ICD-10-CM | POA: Diagnosis present

## 2018-03-14 DIAGNOSIS — E46 Unspecified protein-calorie malnutrition: Secondary | ICD-10-CM | POA: Diagnosis present

## 2018-03-14 DIAGNOSIS — I82409 Acute embolism and thrombosis of unspecified deep veins of unspecified lower extremity: Secondary | ICD-10-CM

## 2018-03-14 DIAGNOSIS — Z66 Do not resuscitate: Secondary | ICD-10-CM | POA: Diagnosis present

## 2018-03-14 DIAGNOSIS — Z6836 Body mass index (BMI) 36.0-36.9, adult: Secondary | ICD-10-CM | POA: Diagnosis not present

## 2018-03-14 DIAGNOSIS — I5021 Acute systolic (congestive) heart failure: Secondary | ICD-10-CM | POA: Diagnosis not present

## 2018-03-14 DIAGNOSIS — A0472 Enterocolitis due to Clostridium difficile, not specified as recurrent: Secondary | ICD-10-CM | POA: Diagnosis not present

## 2018-03-14 DIAGNOSIS — I5020 Unspecified systolic (congestive) heart failure: Secondary | ICD-10-CM | POA: Diagnosis not present

## 2018-03-14 DIAGNOSIS — Z743 Need for continuous supervision: Secondary | ICD-10-CM | POA: Diagnosis not present

## 2018-03-14 DIAGNOSIS — E876 Hypokalemia: Secondary | ICD-10-CM | POA: Diagnosis not present

## 2018-03-14 DIAGNOSIS — N189 Chronic kidney disease, unspecified: Secondary | ICD-10-CM | POA: Diagnosis present

## 2018-03-14 DIAGNOSIS — R238 Other skin changes: Secondary | ICD-10-CM

## 2018-03-14 DIAGNOSIS — J90 Pleural effusion, not elsewhere classified: Secondary | ICD-10-CM | POA: Diagnosis not present

## 2018-03-14 DIAGNOSIS — I82621 Acute embolism and thrombosis of deep veins of right upper extremity: Secondary | ICD-10-CM | POA: Diagnosis not present

## 2018-03-14 DIAGNOSIS — N179 Acute kidney failure, unspecified: Secondary | ICD-10-CM | POA: Diagnosis present

## 2018-03-14 DIAGNOSIS — R41841 Cognitive communication deficit: Secondary | ICD-10-CM | POA: Diagnosis not present

## 2018-03-14 DIAGNOSIS — R262 Difficulty in walking, not elsewhere classified: Secondary | ICD-10-CM | POA: Diagnosis not present

## 2018-03-14 DIAGNOSIS — H9190 Unspecified hearing loss, unspecified ear: Secondary | ICD-10-CM | POA: Diagnosis present

## 2018-03-14 DIAGNOSIS — G459 Transient cerebral ischemic attack, unspecified: Secondary | ICD-10-CM | POA: Diagnosis not present

## 2018-03-14 DIAGNOSIS — M6281 Muscle weakness (generalized): Secondary | ICD-10-CM | POA: Diagnosis not present

## 2018-03-14 DIAGNOSIS — Z7901 Long term (current) use of anticoagulants: Secondary | ICD-10-CM | POA: Diagnosis not present

## 2018-03-15 ENCOUNTER — Other Ambulatory Visit (HOSPITAL_COMMUNITY): Payer: Self-pay

## 2018-03-15 LAB — COMPREHENSIVE METABOLIC PANEL
ALT: 15 U/L (ref 0–44)
ANION GAP: 12 (ref 5–15)
AST: 16 U/L (ref 15–41)
Albumin: 2.4 g/dL — ABNORMAL LOW (ref 3.5–5.0)
Alkaline Phosphatase: 59 U/L (ref 38–126)
BUN: 49 mg/dL — ABNORMAL HIGH (ref 8–23)
CALCIUM: 9.3 mg/dL (ref 8.9–10.3)
CO2: 26 mmol/L (ref 22–32)
Chloride: 96 mmol/L — ABNORMAL LOW (ref 98–111)
Creatinine, Ser: 1.96 mg/dL — ABNORMAL HIGH (ref 0.61–1.24)
GFR calc non Af Amer: 30 mL/min — ABNORMAL LOW (ref >60)
GFR, EST AFRICAN AMERICAN: 35 mL/min — AB (ref >60)
Glucose, Bld: 187 mg/dL — ABNORMAL HIGH (ref 70–99)
Potassium: 3.9 mmol/L (ref 3.5–5.1)
Sodium: 134 mmol/L — ABNORMAL LOW (ref 135–145)
Total Bilirubin: 1.6 mg/dL — ABNORMAL HIGH (ref 0.3–1.2)
Total Protein: 6.7 g/dL (ref 6.5–8.1)

## 2018-03-15 LAB — PROTIME-INR
INR: 1.3 — ABNORMAL HIGH (ref 0.8–1.2)
Prothrombin Time: 16.3 seconds — ABNORMAL HIGH (ref 11.4–15.2)

## 2018-03-15 LAB — CBC
HCT: 38.4 % — ABNORMAL LOW (ref 39.0–52.0)
Hemoglobin: 12.3 g/dL — ABNORMAL LOW (ref 13.0–17.0)
MCH: 27.2 pg (ref 26.0–34.0)
MCHC: 32 g/dL (ref 30.0–36.0)
MCV: 84.8 fL (ref 80.0–100.0)
Platelets: 649 10*3/uL — ABNORMAL HIGH (ref 150–400)
RBC: 4.53 MIL/uL (ref 4.22–5.81)
RDW: 17 % — ABNORMAL HIGH (ref 11.5–15.5)
WBC: 12.1 10*3/uL — ABNORMAL HIGH (ref 4.0–10.5)
nRBC: 0 % (ref 0.0–0.2)

## 2018-03-15 LAB — URINALYSIS, ROUTINE W REFLEX MICROSCOPIC
Bacteria, UA: NONE SEEN
Bilirubin Urine: NEGATIVE
Glucose, UA: NEGATIVE mg/dL
Hgb urine dipstick: NEGATIVE
KETONES UR: NEGATIVE mg/dL
Leukocytes,Ua: NEGATIVE
Nitrite: NEGATIVE
Protein, ur: 30 mg/dL — AB
Specific Gravity, Urine: 1.013 (ref 1.005–1.030)
pH: 5 (ref 5.0–8.0)

## 2018-03-15 LAB — BRAIN NATRIURETIC PEPTIDE: B NATRIURETIC PEPTIDE 5: 580.6 pg/mL — AB (ref 0.0–100.0)

## 2018-03-15 LAB — HEMOGLOBIN A1C
Hgb A1c MFr Bld: 8.1 % — ABNORMAL HIGH (ref 4.8–5.6)
Mean Plasma Glucose: 185.77 mg/dL

## 2018-03-15 LAB — APTT: aPTT: 38 seconds — ABNORMAL HIGH (ref 24–36)

## 2018-03-15 NOTE — Consult Note (Signed)
Referring Physician: Merton Border, MD/Priya Kristoff Coonradt is an 85 y.o. male.                       Chief Complaint: Atrial fibrillation  HPI: 85 year old male with chronic systolic biventricular failure has atrial fibrillation with rapid ventricular response, poorly controlled with amiodarone, metoprolol and diltiazem use. He has PMH of COPD, hypertension, TIA, Syncope, dementia, weakness and h/o pneumonia. His recent echocardiogram showed moderate LV and RV systolic dysfunction.   Past medical history on file. Negative for DM, type 2 and as in HPI.   The histories are not reviewed yet. Please review them in the "History" navigator section and refresh this [place].  Family history : Non-contributory. . Social History:  has no history on file for tobacco, alcohol, and drug.  Allergies: PCN.  No medications prior to admission.  See list in chart.  Results for orders placed or performed during the hospital encounter of 03/14/18 (from the past 48 hour(s))  CBC     Status: Abnormal   Collection Time: 03/15/18  5:52 AM  Result Value Ref Range   WBC 12.1 (H) 4.0 - 10.5 K/uL   RBC 4.53 4.22 - 5.81 MIL/uL   Hemoglobin 12.3 (L) 13.0 - 17.0 g/dL   HCT 38.4 (L) 39.0 - 52.0 %   MCV 84.8 80.0 - 100.0 fL   MCH 27.2 26.0 - 34.0 pg   MCHC 32.0 30.0 - 36.0 g/dL   RDW 17.0 (H) 11.5 - 15.5 %   Platelets 649 (H) 150 - 400 K/uL   nRBC 0.0 0.0 - 0.2 %    Comment: Performed at [facility], [address] [place], [place] [address]  Comprehensive metabolic panel     Status: Abnormal   Collection Time: 03/15/18  5:52 AM  Result Value Ref Range   Sodium 134 (L) 135 - 145 mmol/L   Potassium 3.9 3.5 - 5.1 mmol/L   Chloride 96 (L) 98 - 111 mmol/L   CO2 26 22 - 32 mmol/L   Glucose, Bld 187 (H) 70 - 99 mg/dL   BUN 49 (H) 8 - 23 mg/dL   Creatinine, Ser 1.96 (H) 0.61 - 1.24 mg/dL   Calcium 9.3 8.9 - 10.3 mg/dL   Total Protein 6.7 6.5 - 8.1 g/dL   Albumin 2.4 (L) 3.5 - 5.0 g/dL   AST 16 15 - 41 U/L   ALT 15 0 - 44 U/L   Alkaline Phosphatase 59 38 - 126 U/L   Total Bilirubin 1.6 (H) 0.3 - 1.2 mg/dL   GFR calc non Af Amer 30 (L) >60 mL/min   GFR calc Af Amer 35 (L) >60 mL/min   Anion gap 12 5 - 15    Comment: Performed at [facility], [place] [address] [place], [place] [address]  Hemoglobin A1c     Status: Abnormal   Collection Time: 03/15/18  5:52 AM  Result Value Ref Range   Hgb A1c MFr Bld 8.1 (H) 4.8 - 5.6 %    Comment: (NOTE) Pre diabetes:          5.7%-6.4% Diabetes:              >6.4% Glycemic control for   <7.0% adults with diabetes    Mean Plasma Glucose 185.77 mg/dL    Comment: Performed at [facility] [address] [place],  [address]  Brain natriuretic peptide  Status: Abnormal   Collection Time: 03/15/18  5:52 AM  Result Value Ref Range   B Natriuretic Peptide 580.6 (H) 0.0 - 100.0 pg/mL    Comment: Performed at [facility] [address] [place], [place] [address]  Protime-INR     Status: Abnormal   Collection Time: 03/15/18  5:52 AM  Result Value Ref Range   Prothrombin Time 16.3 (H) 11.4 - 15.2 seconds   INR 1.3 (H) 0.8 - 1.2    Comment: (NOTE) INR goal varies based on device and disease states. Performed at [facility], [place] [address] [place], [place] [address]   APTT     Status: Abnormal   Collection Time: 03/15/18  5:52 AM  Result Value Ref Range   aPTT 38 (H) 24 - 36 seconds    Comment:        IF BASELINE aPTT IS ELEVATED, SUGGEST PATIENT RISK ASSESSMENT BE USED TO DETERMINE APPROPRIATE ANTICOAGULANT THERAPY. Performed at [facility], [place] [address] [place], [place] [address]    Dg Chest Port 1 View  Result Date: 03/15/2018 CLINICAL DATA:  With congestive heart failure. EXAM: PORTABLE CHEST 1 VIEW COMPARISON:  03/05/2018. FINDINGS: Cardiomegaly. Diffuse bilateral from interstitial prominence. Findings consistent CHF. Slight interim improvement in aeration from prior exam. Tiny  left pleural effusion. No pneumothorax. IMPRESSION: Cardiomegaly with diffuse bilateral pulmonary interstitial prominence. Findings suggest CHF. Slight interim improvement in aeration from prior exam. Electronically Signed   By: [place]   On: 03/15/2018 07:33    Review Of Systems As in HPI.  Patient unable to give information.  P : 110, R-16, BP-140/90. There were no vitals taken for this visit. There is no height or weight on file to calculate BMI. General appearance: alert, cooperative, appears stated age and no distress Head: Normocephalic, atraumatic. Eyes: Blue eyes, pink conjunctiva, corneas clear. PERRL, EOM's intact. Neck: No adenopathy, no carotid bruit, no JVD, supple, symmetrical, trachea midline and thyroid not enlarged. Resp: Clear to auscultation bilaterally. Cardio: Rapid, irregular rate and rhythm, S1, S2 normal, III/VI systolic murmur, no click, rub or gallop GI: Soft, non-tender; bowel sounds normal; no organomegaly. Extremities: No edema, cyanosis or clubbing. Skin: Warm and dry.  Neurologic: Alert and oriented X 1, normal strength. Normal coordination..  Assessment/Plan Atrial fibrillation with rapid ventricular response. Biventricular systolic heart failure HTN COPD Dementia Acute renal failure Possible dehydration  Add digoxin. Monitor renal function. Continue amiodarone, diltiazem and metoprolol for now.  Birdie Riddle, MD  03/15/2018, 10:21 AM

## 2018-03-16 LAB — BASIC METABOLIC PANEL
Anion gap: 9 (ref 5–15)
BUN: 45 mg/dL — ABNORMAL HIGH (ref 8–23)
CO2: 29 mmol/L (ref 22–32)
Calcium: 9.1 mg/dL (ref 8.9–10.3)
Chloride: 97 mmol/L — ABNORMAL LOW (ref 98–111)
Creatinine, Ser: 2.01 mg/dL — ABNORMAL HIGH (ref 0.61–1.24)
GFR calc Af Amer: 34 mL/min — ABNORMAL LOW (ref >60)
GFR, EST NON AFRICAN AMERICAN: 29 mL/min — AB (ref >60)
Glucose, Bld: 204 mg/dL — ABNORMAL HIGH (ref 70–99)
POTASSIUM: 3.5 mmol/L (ref 3.5–5.1)
Sodium: 135 mmol/L (ref 135–145)

## 2018-03-16 LAB — CBC
HCT: 39.1 % (ref 39.0–52.0)
Hemoglobin: 12.3 g/dL — ABNORMAL LOW (ref 13.0–17.0)
MCH: 27 pg (ref 26.0–34.0)
MCHC: 31.5 g/dL (ref 30.0–36.0)
MCV: 85.7 fL (ref 80.0–100.0)
Platelets: 598 10*3/uL — ABNORMAL HIGH (ref 150–400)
RBC: 4.56 MIL/uL (ref 4.22–5.81)
RDW: 17.1 % — ABNORMAL HIGH (ref 11.5–15.5)
WBC: 11.2 10*3/uL — ABNORMAL HIGH (ref 4.0–10.5)
nRBC: 0 % (ref 0.0–0.2)

## 2018-03-16 LAB — URINE CULTURE: Culture: <10000 — AB

## 2018-03-17 LAB — BASIC METABOLIC PANEL
Anion gap: 9 (ref 5–15)
BUN: 46 mg/dL — ABNORMAL HIGH (ref 8–23)
CO2: 27 mmol/L (ref 22–32)
Calcium: 9.1 mg/dL (ref 8.9–10.3)
Chloride: 98 mmol/L (ref 98–111)
Creatinine, Ser: 1.9 mg/dL — ABNORMAL HIGH (ref 0.61–1.24)
GFR calc Af Amer: 36 mL/min — ABNORMAL LOW (ref >60)
GFR calc non Af Amer: 31 mL/min — ABNORMAL LOW (ref >60)
Glucose, Bld: 170 mg/dL — ABNORMAL HIGH (ref 70–99)
Potassium: 3.2 mmol/L — ABNORMAL LOW (ref 3.5–5.1)
Sodium: 134 mmol/L — ABNORMAL LOW (ref 135–145)

## 2018-03-17 LAB — POTASSIUM: Potassium: 3.8 mmol/L (ref 3.5–5.1)

## 2018-03-17 LAB — MAGNESIUM: Magnesium: 1.9 mg/dL (ref 1.7–2.4)

## 2018-03-17 LAB — PHOSPHORUS: Phosphorus: 2.9 mg/dL (ref 2.5–4.6)

## 2018-03-18 ENCOUNTER — Other Ambulatory Visit (HOSPITAL_COMMUNITY): Payer: Self-pay

## 2018-03-18 LAB — POTASSIUM: Potassium: 3.5 mmol/L (ref 3.5–5.1)

## 2018-03-19 LAB — BASIC METABOLIC PANEL
Anion gap: 10 (ref 5–15)
BUN: 47 mg/dL — ABNORMAL HIGH (ref 8–23)
CALCIUM: 9.4 mg/dL (ref 8.9–10.3)
CO2: 27 mmol/L (ref 22–32)
Chloride: 101 mmol/L (ref 98–111)
Creatinine, Ser: 1.96 mg/dL — ABNORMAL HIGH (ref 0.61–1.24)
GFR calc non Af Amer: 30 mL/min — ABNORMAL LOW (ref >60)
GFR, EST AFRICAN AMERICAN: 35 mL/min — AB (ref >60)
Glucose, Bld: 147 mg/dL — ABNORMAL HIGH (ref 70–99)
Potassium: 3.5 mmol/L (ref 3.5–5.1)
Sodium: 138 mmol/L (ref 135–145)

## 2018-03-19 LAB — URINALYSIS, ROUTINE W REFLEX MICROSCOPIC
Bacteria, UA: NONE SEEN
Bilirubin Urine: NEGATIVE
Glucose, UA: NEGATIVE mg/dL
Hgb urine dipstick: NEGATIVE
Ketones, ur: NEGATIVE mg/dL
Leukocytes,Ua: NEGATIVE
Nitrite: NEGATIVE
PH: 5 (ref 5.0–8.0)
Protein, ur: 100 mg/dL — AB
Specific Gravity, Urine: 1.019 (ref 1.005–1.030)

## 2018-03-19 LAB — CBC
HCT: 37.4 % — ABNORMAL LOW (ref 39.0–52.0)
Hemoglobin: 11.4 g/dL — ABNORMAL LOW (ref 13.0–17.0)
MCH: 26.5 pg (ref 26.0–34.0)
MCHC: 30.5 g/dL (ref 30.0–36.0)
MCV: 87 fL (ref 80.0–100.0)
Platelets: 489 10*3/uL — ABNORMAL HIGH (ref 150–400)
RBC: 4.3 MIL/uL (ref 4.22–5.81)
RDW: 16.6 % — ABNORMAL HIGH (ref 11.5–15.5)
WBC: 12.7 10*3/uL — ABNORMAL HIGH (ref 4.0–10.5)
nRBC: 0 % (ref 0.0–0.2)

## 2018-03-19 LAB — MAGNESIUM: Magnesium: 2.1 mg/dL (ref 1.7–2.4)

## 2018-03-20 ENCOUNTER — Ambulatory Visit (HOSPITAL_COMMUNITY): Payer: Medicare Other

## 2018-03-20 LAB — RENAL FUNCTION PANEL
ALBUMIN: 1.8 g/dL — AB (ref 3.5–5.0)
Anion gap: 8 (ref 5–15)
BUN: 43 mg/dL — ABNORMAL HIGH (ref 8–23)
CO2: 28 mmol/L (ref 22–32)
Calcium: 9.2 mg/dL (ref 8.9–10.3)
Chloride: 102 mmol/L (ref 98–111)
Creatinine, Ser: 1.91 mg/dL — ABNORMAL HIGH (ref 0.61–1.24)
GFR calc Af Amer: 36 mL/min — ABNORMAL LOW (ref >60)
GFR calc non Af Amer: 31 mL/min — ABNORMAL LOW (ref >60)
Glucose, Bld: 178 mg/dL — ABNORMAL HIGH (ref 70–99)
PHOSPHORUS: 3.3 mg/dL (ref 2.5–4.6)
POTASSIUM: 3.4 mmol/L — AB (ref 3.5–5.1)
SODIUM: 138 mmol/L (ref 135–145)

## 2018-03-20 LAB — CBC
HCT: 35.4 % — ABNORMAL LOW (ref 39.0–52.0)
Hemoglobin: 10.9 g/dL — ABNORMAL LOW (ref 13.0–17.0)
MCH: 26.8 pg (ref 26.0–34.0)
MCHC: 30.8 g/dL (ref 30.0–36.0)
MCV: 87 fL (ref 80.0–100.0)
Platelets: 440 10*3/uL — ABNORMAL HIGH (ref 150–400)
RBC: 4.07 MIL/uL — ABNORMAL LOW (ref 4.22–5.81)
RDW: 16.5 % — ABNORMAL HIGH (ref 11.5–15.5)
WBC: 10.4 10*3/uL (ref 4.0–10.5)
nRBC: 0 % (ref 0.0–0.2)

## 2018-03-20 LAB — MAGNESIUM: MAGNESIUM: 2.1 mg/dL (ref 1.7–2.4)

## 2018-03-21 ENCOUNTER — Encounter (HOSPITAL_BASED_OUTPATIENT_CLINIC_OR_DEPARTMENT_OTHER): Payer: Self-pay

## 2018-03-21 DIAGNOSIS — M7989 Other specified soft tissue disorders: Secondary | ICD-10-CM

## 2018-03-21 LAB — URINE CULTURE: Culture: <10000 — AB

## 2018-03-21 LAB — POTASSIUM: Potassium: 3.5 mmol/L (ref 3.5–5.1)

## 2018-03-21 NOTE — Consult Note (Signed)
Ref: Default, Provider, MD   Subjective:  Now has right axillary vein DVT.  Objective:  Vital Signs in the last 24 hours: BP: ()/()  Arterial Line BP: ()/()   Physical Exam: BP Readings from Last 1 Encounters:  No data found for BP     Wt Readings from Last 1 Encounters:  No data found for Wt    Weight change:  There is no height or weight on file to calculate BMI. HEENT: [place]/AT, Eyes-Blue, PERL, EOMI, Conjunctiva-Pink, Sclera-Non-icteric Neck: No JVD, No bruit, Trachea midline. Lungs:  Clear, Bilateral. Cardiac:  Regular rhythm, normal S1 and S2, no S3. II/VI systolic murmur. Abdomen:  Soft, non-tender. BS present. Extremities:  No edema present. No cyanosis. No clubbing. CNS: AxOx1, Cranial nerves grossly intact, moves all 4 extremities.  Skin: Warm and dry.   Intake/Output from previous day: No intake/output data recorded.    Lab Results: BMET    Component Value Date/Time   NA 138 03/20/2018 0515   NA 138 03/19/2018 0450   NA 134 (L) 03/17/2018 0410   K 3.5 03/21/2018 0624   K 3.4 (L) 03/20/2018 0515   K 3.5 03/19/2018 0450   CL 102 03/20/2018 0515   CL 101 03/19/2018 0450   CL 98 03/17/2018 0410   CO2 28 03/20/2018 0515   CO2 27 03/19/2018 0450   CO2 27 03/17/2018 0410   GLUCOSE 178 (H) 03/20/2018 0515   GLUCOSE 147 (H) 03/19/2018 0450   GLUCOSE 170 (H) 03/17/2018 0410   BUN 43 (H) 03/20/2018 0515   BUN 47 (H) 03/19/2018 0450   BUN 46 (H) 03/17/2018 0410   CREATININE 1.91 (H) 03/20/2018 0515   CREATININE 1.96 (H) 03/19/2018 0450   CREATININE 1.90 (H) 03/17/2018 0410   CALCIUM 9.2 03/20/2018 0515   CALCIUM 9.4 03/19/2018 0450   CALCIUM 9.1 03/17/2018 0410   GFRNONAA 31 (L) 03/20/2018 0515   GFRNONAA 30 (L) 03/19/2018 0450   GFRNONAA 31 (L) 03/17/2018 0410   GFRAA 36 (L) 03/20/2018 0515   GFRAA 35 (L) 03/19/2018 0450   GFRAA 36 (L) 03/17/2018 0410   CBC    Component Value Date/Time   WBC 10.4 03/20/2018 0515   RBC 4.07 (L) 03/20/2018 0515   HGB 10.9 (L) 03/20/2018 0515   HCT 35.4 (L) 03/20/2018 0515   PLT 440 (H) 03/20/2018 0515   MCV 87.0 03/20/2018 0515   MCH 26.8 03/20/2018 0515   MCHC 30.8 03/20/2018 0515   RDW 16.5 (H) 03/20/2018 0515   HEPATIC Function Panel Recent Labs    03/15/18 0552  PROT 6.7   HEMOGLOBIN A1C No components found for: HGA1C,  MPG CARDIAC ENZYMES No results found for: CKTOTAL, CKMB, CKMBINDEX, TROPONINI BNP No results for input(s): PROBNP in the last 8760 hours. TSH No results for input(s): TSH in the last 8760 hours. CHOLESTEROL No results for input(s): CHOL in the last 8760 hours.  Scheduled Meds: Continuous Infusions: PRN Meds:.  Assessment/Plan: Acute right axillary vein DVT and cephalic vein superficial thrombus Atrial fibrillation with controlled ventricular response Biventricular systolic heart failure HTN COPD Dementia CKD, III  Continue Eliquis. Continue Amiodarone, diltiazema dn metoprolol   LOS: 0 days    Dixie Dials  MD  03/21/2018, 10:24 AM

## 2018-03-21 NOTE — Progress Notes (Signed)
VASCULAR LAB PRELIMINARY  PRELIMINARY  PRELIMINARY  PRELIMINARY  Right upper extremity venous duplex completed.    Preliminary report:  See CV Proc for results  Sharion Dove, RVT 03/21/2018, 9:38 AM

## 2018-03-22 LAB — MAGNESIUM: Magnesium: 1.9 mg/dL (ref 1.7–2.4)

## 2018-03-22 LAB — RENAL FUNCTION PANEL
ALBUMIN: 2 g/dL — AB (ref 3.5–5.0)
Anion gap: 9 (ref 5–15)
BUN: 32 mg/dL — ABNORMAL HIGH (ref 8–23)
CO2: 25 mmol/L (ref 22–32)
Calcium: 9.6 mg/dL (ref 8.9–10.3)
Chloride: 102 mmol/L (ref 98–111)
Creatinine, Ser: 1.57 mg/dL — ABNORMAL HIGH (ref 0.61–1.24)
GFR calc Af Amer: 45 mL/min — ABNORMAL LOW (ref >60)
GFR calc non Af Amer: 39 mL/min — ABNORMAL LOW (ref >60)
Glucose, Bld: 208 mg/dL — ABNORMAL HIGH (ref 70–99)
PHOSPHORUS: 2.4 mg/dL — AB (ref 2.5–4.6)
POTASSIUM: 3.8 mmol/L (ref 3.5–5.1)
Sodium: 136 mmol/L (ref 135–145)

## 2018-03-22 LAB — CBC
HCT: 37.9 % — ABNORMAL LOW (ref 39.0–52.0)
Hemoglobin: 11.4 g/dL — ABNORMAL LOW (ref 13.0–17.0)
MCH: 26 pg (ref 26.0–34.0)
MCHC: 30.1 g/dL (ref 30.0–36.0)
MCV: 86.3 fL (ref 80.0–100.0)
NRBC: 0 % (ref 0.0–0.2)
Platelets: 513 10*3/uL — ABNORMAL HIGH (ref 150–400)
RBC: 4.39 MIL/uL (ref 4.22–5.81)
RDW: 16.2 % — ABNORMAL HIGH (ref 11.5–15.5)
WBC: 9.6 10*3/uL (ref 4.0–10.5)

## 2018-03-22 LAB — C DIFFICILE QUICK SCREEN W PCR REFLEX
C Diff antigen: POSITIVE — AB
C Diff toxin: NEGATIVE

## 2018-03-22 LAB — CLOSTRIDIUM DIFFICILE BY PCR, REFLEXED: Toxigenic C. Difficile by PCR: POSITIVE — AB

## 2018-03-23 LAB — CULTURE, BLOOD (ROUTINE X 2)
Culture: NO GROWTH
Culture: NO GROWTH
SPECIAL REQUESTS: ADEQUATE
Special Requests: ADEQUATE

## 2018-03-24 LAB — CBC
HCT: 40.9 % (ref 39.0–52.0)
Hemoglobin: 12.6 g/dL — ABNORMAL LOW (ref 13.0–17.0)
MCH: 26.9 pg (ref 26.0–34.0)
MCHC: 30.8 g/dL (ref 30.0–36.0)
MCV: 87.4 fL (ref 80.0–100.0)
Platelets: 557 10*3/uL — ABNORMAL HIGH (ref 150–400)
RBC: 4.68 MIL/uL (ref 4.22–5.81)
RDW: 16.4 % — ABNORMAL HIGH (ref 11.5–15.5)
WBC: 10.1 10*3/uL (ref 4.0–10.5)
nRBC: 0 % (ref 0.0–0.2)

## 2018-03-24 LAB — RENAL FUNCTION PANEL
Albumin: 2.2 g/dL — ABNORMAL LOW (ref 3.5–5.0)
Anion gap: 10 (ref 5–15)
BUN: 27 mg/dL — ABNORMAL HIGH (ref 8–23)
CO2: 24 mmol/L (ref 22–32)
Calcium: 9.8 mg/dL (ref 8.9–10.3)
Chloride: 104 mmol/L (ref 98–111)
Creatinine, Ser: 1.57 mg/dL — ABNORMAL HIGH (ref 0.61–1.24)
GFR calc Af Amer: 45 mL/min — ABNORMAL LOW (ref >60)
GFR calc non Af Amer: 39 mL/min — ABNORMAL LOW (ref >60)
GLUCOSE: 127 mg/dL — AB (ref 70–99)
PHOSPHORUS: 2.8 mg/dL (ref 2.5–4.6)
Potassium: 4.2 mmol/L (ref 3.5–5.1)
SODIUM: 138 mmol/L (ref 135–145)

## 2018-03-24 LAB — MAGNESIUM: Magnesium: 1.8 mg/dL (ref 1.7–2.4)

## 2018-03-27 LAB — BASIC METABOLIC PANEL
Anion gap: 6 (ref 5–15)
BUN: 26 mg/dL — ABNORMAL HIGH (ref 8–23)
CHLORIDE: 103 mmol/L (ref 98–111)
CO2: 29 mmol/L (ref 22–32)
Calcium: 10.2 mg/dL (ref 8.9–10.3)
Creatinine, Ser: 1.64 mg/dL — ABNORMAL HIGH (ref 0.61–1.24)
GFR calc Af Amer: 43 mL/min — ABNORMAL LOW (ref >60)
GFR calc non Af Amer: 37 mL/min — ABNORMAL LOW (ref >60)
Glucose, Bld: 221 mg/dL — ABNORMAL HIGH (ref 70–99)
Potassium: 3.9 mmol/L (ref 3.5–5.1)
SODIUM: 138 mmol/L (ref 135–145)

## 2018-03-29 LAB — CBC
HCT: 35.6 % — ABNORMAL LOW (ref 39.0–52.0)
Hemoglobin: 10.8 g/dL — ABNORMAL LOW (ref 13.0–17.0)
MCH: 26.7 pg (ref 26.0–34.0)
MCHC: 30.3 g/dL (ref 30.0–36.0)
MCV: 88.1 fL (ref 80.0–100.0)
Platelets: 529 10*3/uL — ABNORMAL HIGH (ref 150–400)
RBC: 4.04 MIL/uL — ABNORMAL LOW (ref 4.22–5.81)
RDW: 16.9 % — AB (ref 11.5–15.5)
WBC: 9.8 10*3/uL (ref 4.0–10.5)
nRBC: 0 % (ref 0.0–0.2)

## 2018-03-29 LAB — BASIC METABOLIC PANEL
Anion gap: 8 (ref 5–15)
BUN: 29 mg/dL — ABNORMAL HIGH (ref 8–23)
CALCIUM: 9.8 mg/dL (ref 8.9–10.3)
CO2: 27 mmol/L (ref 22–32)
Chloride: 104 mmol/L (ref 98–111)
Creatinine, Ser: 1.67 mg/dL — ABNORMAL HIGH (ref 0.61–1.24)
GFR calc non Af Amer: 36 mL/min — ABNORMAL LOW (ref >60)
GFR, EST AFRICAN AMERICAN: 42 mL/min — AB (ref >60)
Glucose, Bld: 108 mg/dL — ABNORMAL HIGH (ref 70–99)
Potassium: 3.6 mmol/L (ref 3.5–5.1)
Sodium: 139 mmol/L (ref 135–145)

## 2018-03-29 LAB — MAGNESIUM: Magnesium: 1.8 mg/dL (ref 1.7–2.4)

## 2018-04-01 LAB — RENAL FUNCTION PANEL
Albumin: 2.4 g/dL — ABNORMAL LOW (ref 3.5–5.0)
Anion gap: 11 (ref 5–15)
BUN: 36 mg/dL — ABNORMAL HIGH (ref 8–23)
CHLORIDE: 98 mmol/L (ref 98–111)
CO2: 27 mmol/L (ref 22–32)
Calcium: 9.7 mg/dL (ref 8.9–10.3)
Creatinine, Ser: 1.79 mg/dL — ABNORMAL HIGH (ref 0.61–1.24)
GFR calc Af Amer: 39 mL/min — ABNORMAL LOW (ref >60)
GFR calc non Af Amer: 33 mL/min — ABNORMAL LOW (ref >60)
Glucose, Bld: 172 mg/dL — ABNORMAL HIGH (ref 70–99)
Phosphorus: 3.3 mg/dL (ref 2.5–4.6)
Potassium: 3.7 mmol/L (ref 3.5–5.1)
Sodium: 136 mmol/L (ref 135–145)

## 2018-04-02 ENCOUNTER — Other Ambulatory Visit (HOSPITAL_COMMUNITY): Payer: Self-pay

## 2018-04-02 LAB — URINALYSIS, ROUTINE W REFLEX MICROSCOPIC
Bilirubin Urine: NEGATIVE
Glucose, UA: NEGATIVE mg/dL
Hgb urine dipstick: NEGATIVE
Ketones, ur: NEGATIVE mg/dL
Nitrite: NEGATIVE
Protein, ur: 100 mg/dL — AB
Specific Gravity, Urine: 1.018 (ref 1.005–1.030)
pH: 5 (ref 5.0–8.0)

## 2018-04-02 LAB — CBC
HCT: 36.7 % — ABNORMAL LOW (ref 39.0–52.0)
Hemoglobin: 11.6 g/dL — ABNORMAL LOW (ref 13.0–17.0)
MCH: 27.6 pg (ref 26.0–34.0)
MCHC: 31.6 g/dL (ref 30.0–36.0)
MCV: 87.2 fL (ref 80.0–100.0)
Platelets: 624 10*3/uL — ABNORMAL HIGH (ref 150–400)
RBC: 4.21 MIL/uL — AB (ref 4.22–5.81)
RDW: 17.2 % — ABNORMAL HIGH (ref 11.5–15.5)
WBC: 13.4 10*3/uL — ABNORMAL HIGH (ref 4.0–10.5)
nRBC: 0 % (ref 0.0–0.2)

## 2018-04-02 LAB — RENAL FUNCTION PANEL
ALBUMIN: 2.3 g/dL — AB (ref 3.5–5.0)
Anion gap: 9 (ref 5–15)
BUN: 42 mg/dL — ABNORMAL HIGH (ref 8–23)
CO2: 24 mmol/L (ref 22–32)
Calcium: 9.3 mg/dL (ref 8.9–10.3)
Chloride: 100 mmol/L (ref 98–111)
Creatinine, Ser: 1.84 mg/dL — ABNORMAL HIGH (ref 0.61–1.24)
GFR calc Af Amer: 37 mL/min — ABNORMAL LOW (ref >60)
GFR calc non Af Amer: 32 mL/min — ABNORMAL LOW (ref >60)
Glucose, Bld: 168 mg/dL — ABNORMAL HIGH (ref 70–99)
PHOSPHORUS: 3.3 mg/dL (ref 2.5–4.6)
Potassium: 3.8 mmol/L (ref 3.5–5.1)
SODIUM: 133 mmol/L — AB (ref 135–145)

## 2018-04-02 LAB — MAGNESIUM: Magnesium: 1.9 mg/dL (ref 1.7–2.4)

## 2018-04-03 LAB — CBC
HCT: 32.3 % — ABNORMAL LOW (ref 39.0–52.0)
Hemoglobin: 10.4 g/dL — ABNORMAL LOW (ref 13.0–17.0)
MCH: 28.1 pg (ref 26.0–34.0)
MCHC: 32.2 g/dL (ref 30.0–36.0)
MCV: 87.3 fL (ref 80.0–100.0)
NRBC: 0 % (ref 0.0–0.2)
PLATELETS: 546 10*3/uL — AB (ref 150–400)
RBC: 3.7 MIL/uL — AB (ref 4.22–5.81)
RDW: 17.2 % — AB (ref 11.5–15.5)
WBC: 11.4 10*3/uL — ABNORMAL HIGH (ref 4.0–10.5)

## 2018-04-03 LAB — RENAL FUNCTION PANEL
Albumin: 2.1 g/dL — ABNORMAL LOW (ref 3.5–5.0)
Anion gap: 4 — ABNORMAL LOW (ref 5–15)
BUN: 50 mg/dL — ABNORMAL HIGH (ref 8–23)
CO2: 26 mmol/L (ref 22–32)
Calcium: 8.8 mg/dL — ABNORMAL LOW (ref 8.9–10.3)
Chloride: 104 mmol/L (ref 98–111)
Creatinine, Ser: 2.05 mg/dL — ABNORMAL HIGH (ref 0.61–1.24)
GFR calc non Af Amer: 28 mL/min — ABNORMAL LOW (ref >60)
GFR, EST AFRICAN AMERICAN: 33 mL/min — AB (ref >60)
Glucose, Bld: 165 mg/dL — ABNORMAL HIGH (ref 70–99)
Phosphorus: 3.5 mg/dL (ref 2.5–4.6)
Potassium: 3.4 mmol/L — ABNORMAL LOW (ref 3.5–5.1)
Sodium: 134 mmol/L — ABNORMAL LOW (ref 135–145)

## 2018-04-03 LAB — URINE CULTURE: Culture: <10000 — AB

## 2018-04-03 LAB — MAGNESIUM: Magnesium: 1.9 mg/dL (ref 1.7–2.4)

## 2018-04-05 DIAGNOSIS — J984 Other disorders of lung: Secondary | ICD-10-CM | POA: Diagnosis not present

## 2018-04-05 DIAGNOSIS — M6281 Muscle weakness (generalized): Secondary | ICD-10-CM | POA: Diagnosis not present

## 2018-04-05 DIAGNOSIS — K219 Gastro-esophageal reflux disease without esophagitis: Secondary | ICD-10-CM | POA: Diagnosis not present

## 2018-04-05 DIAGNOSIS — I5021 Acute systolic (congestive) heart failure: Secondary | ICD-10-CM | POA: Diagnosis not present

## 2018-04-05 DIAGNOSIS — N189 Chronic kidney disease, unspecified: Secondary | ICD-10-CM | POA: Diagnosis not present

## 2018-04-05 DIAGNOSIS — I517 Cardiomegaly: Secondary | ICD-10-CM | POA: Diagnosis not present

## 2018-04-05 DIAGNOSIS — I130 Hypertensive heart and chronic kidney disease with heart failure and stage 1 through stage 4 chronic kidney disease, or unspecified chronic kidney disease: Secondary | ICD-10-CM | POA: Diagnosis not present

## 2018-04-05 DIAGNOSIS — E785 Hyperlipidemia, unspecified: Secondary | ICD-10-CM | POA: Diagnosis not present

## 2018-04-05 DIAGNOSIS — R262 Difficulty in walking, not elsewhere classified: Secondary | ICD-10-CM | POA: Diagnosis not present

## 2018-04-05 DIAGNOSIS — J440 Chronic obstructive pulmonary disease with acute lower respiratory infection: Secondary | ICD-10-CM | POA: Diagnosis not present

## 2018-04-05 DIAGNOSIS — A0471 Enterocolitis due to Clostridium difficile, recurrent: Secondary | ICD-10-CM | POA: Diagnosis not present

## 2018-04-05 DIAGNOSIS — R41841 Cognitive communication deficit: Secondary | ICD-10-CM | POA: Diagnosis not present

## 2018-04-05 DIAGNOSIS — R410 Disorientation, unspecified: Secondary | ICD-10-CM | POA: Diagnosis not present

## 2018-04-05 DIAGNOSIS — J181 Lobar pneumonia, unspecified organism: Secondary | ICD-10-CM | POA: Diagnosis not present

## 2018-04-05 DIAGNOSIS — I10 Essential (primary) hypertension: Secondary | ICD-10-CM | POA: Diagnosis not present

## 2018-04-05 DIAGNOSIS — I5022 Chronic systolic (congestive) heart failure: Secondary | ICD-10-CM | POA: Diagnosis not present

## 2018-04-05 DIAGNOSIS — J90 Pleural effusion, not elsewhere classified: Secondary | ICD-10-CM | POA: Diagnosis not present

## 2018-04-05 DIAGNOSIS — I4891 Unspecified atrial fibrillation: Secondary | ICD-10-CM | POA: Diagnosis not present

## 2018-04-05 DIAGNOSIS — I482 Chronic atrial fibrillation, unspecified: Secondary | ICD-10-CM | POA: Diagnosis not present

## 2018-04-05 DIAGNOSIS — R279 Unspecified lack of coordination: Secondary | ICD-10-CM | POA: Diagnosis not present

## 2018-04-05 DIAGNOSIS — F0390 Unspecified dementia without behavioral disturbance: Secondary | ICD-10-CM | POA: Diagnosis not present

## 2018-04-05 DIAGNOSIS — G459 Transient cerebral ischemic attack, unspecified: Secondary | ICD-10-CM | POA: Diagnosis not present

## 2018-04-05 DIAGNOSIS — R1312 Dysphagia, oropharyngeal phase: Secondary | ICD-10-CM | POA: Diagnosis not present

## 2018-04-05 DIAGNOSIS — J441 Chronic obstructive pulmonary disease with (acute) exacerbation: Secondary | ICD-10-CM | POA: Diagnosis not present

## 2018-04-05 DIAGNOSIS — D649 Anemia, unspecified: Secondary | ICD-10-CM | POA: Diagnosis not present

## 2018-04-05 DIAGNOSIS — E1100 Type 2 diabetes mellitus with hyperosmolarity without nonketotic hyperglycemic-hyperosmolar coma (NKHHC): Secondary | ICD-10-CM | POA: Diagnosis not present

## 2018-04-05 DIAGNOSIS — J189 Pneumonia, unspecified organism: Secondary | ICD-10-CM | POA: Diagnosis not present

## 2018-04-05 DIAGNOSIS — F419 Anxiety disorder, unspecified: Secondary | ICD-10-CM | POA: Diagnosis not present

## 2018-04-05 DIAGNOSIS — J9601 Acute respiratory failure with hypoxia: Secondary | ICD-10-CM | POA: Diagnosis not present

## 2018-04-05 DIAGNOSIS — Z743 Need for continuous supervision: Secondary | ICD-10-CM | POA: Diagnosis not present

## 2018-04-05 LAB — RENAL FUNCTION PANEL
Albumin: 2.1 g/dL — ABNORMAL LOW (ref 3.5–5.0)
Anion gap: 10 (ref 5–15)
BUN: 47 mg/dL — ABNORMAL HIGH (ref 8–23)
CO2: 25 mmol/L (ref 22–32)
Calcium: 9.2 mg/dL (ref 8.9–10.3)
Chloride: 104 mmol/L (ref 98–111)
Creatinine, Ser: 1.83 mg/dL — ABNORMAL HIGH (ref 0.61–1.24)
GFR calc Af Amer: 38 mL/min — ABNORMAL LOW (ref >60)
GFR calc non Af Amer: 32 mL/min — ABNORMAL LOW (ref >60)
Glucose, Bld: 75 mg/dL (ref 70–99)
Phosphorus: 3.6 mg/dL (ref 2.5–4.6)
Potassium: 3.5 mmol/L (ref 3.5–5.1)
Sodium: 139 mmol/L (ref 135–145)

## 2018-04-05 LAB — CBC
HCT: 33.8 % — ABNORMAL LOW (ref 39.0–52.0)
Hemoglobin: 10.7 g/dL — ABNORMAL LOW (ref 13.0–17.0)
MCH: 28.1 pg (ref 26.0–34.0)
MCHC: 31.7 g/dL (ref 30.0–36.0)
MCV: 88.7 fL (ref 80.0–100.0)
PLATELETS: 618 10*3/uL — AB (ref 150–400)
RBC: 3.81 MIL/uL — ABNORMAL LOW (ref 4.22–5.81)
RDW: 17.2 % — ABNORMAL HIGH (ref 11.5–15.5)
WBC: 10 10*3/uL (ref 4.0–10.5)
nRBC: 0 % (ref 0.0–0.2)

## 2018-04-05 LAB — MAGNESIUM: Magnesium: 2 mg/dL (ref 1.7–2.4)

## 2018-04-10 DIAGNOSIS — I5022 Chronic systolic (congestive) heart failure: Secondary | ICD-10-CM | POA: Diagnosis not present

## 2018-04-10 DIAGNOSIS — I482 Chronic atrial fibrillation, unspecified: Secondary | ICD-10-CM | POA: Diagnosis not present

## 2018-04-10 DIAGNOSIS — J441 Chronic obstructive pulmonary disease with (acute) exacerbation: Secondary | ICD-10-CM | POA: Diagnosis not present

## 2018-04-10 DIAGNOSIS — F0390 Unspecified dementia without behavioral disturbance: Secondary | ICD-10-CM | POA: Diagnosis not present

## 2018-04-15 DIAGNOSIS — E1100 Type 2 diabetes mellitus with hyperosmolarity without nonketotic hyperglycemic-hyperosmolar coma (NKHHC): Secondary | ICD-10-CM | POA: Diagnosis not present

## 2018-04-15 DIAGNOSIS — J441 Chronic obstructive pulmonary disease with (acute) exacerbation: Secondary | ICD-10-CM | POA: Diagnosis not present

## 2018-04-15 DIAGNOSIS — I482 Chronic atrial fibrillation, unspecified: Secondary | ICD-10-CM | POA: Diagnosis not present

## 2018-04-15 DIAGNOSIS — I5022 Chronic systolic (congestive) heart failure: Secondary | ICD-10-CM | POA: Diagnosis not present

## 2018-04-22 DIAGNOSIS — E1100 Type 2 diabetes mellitus with hyperosmolarity without nonketotic hyperglycemic-hyperosmolar coma (NKHHC): Secondary | ICD-10-CM | POA: Diagnosis not present

## 2018-04-22 DIAGNOSIS — I482 Chronic atrial fibrillation, unspecified: Secondary | ICD-10-CM | POA: Diagnosis not present

## 2018-04-22 DIAGNOSIS — F0390 Unspecified dementia without behavioral disturbance: Secondary | ICD-10-CM | POA: Diagnosis not present

## 2018-04-22 DIAGNOSIS — I5022 Chronic systolic (congestive) heart failure: Secondary | ICD-10-CM | POA: Diagnosis not present

## 2018-04-23 ENCOUNTER — Other Ambulatory Visit: Payer: Self-pay | Admitting: *Deleted

## 2018-04-23 NOTE — Patient Outreach (Signed)
[facility]) Care Management  04/23/2018  Edward Hanson Oct 12, 1928 [id number]   Collaboration with [facility]. Patient 100 skilled Medicare days are up at end of week.  Patient will more than likely remain LTC at facility.  Plan to sign off, requested that UM let RNCM know if patient discharges home for [facility] CM referral.  Royetta Crochet. Laymond Purser, MSN, RN, [employer] , [place] [phone]) Business Cell  [phone]) Toll Free Office

## 2018-04-24 DIAGNOSIS — I10 Essential (primary) hypertension: Secondary | ICD-10-CM | POA: Diagnosis not present

## 2018-04-24 DIAGNOSIS — I482 Chronic atrial fibrillation, unspecified: Secondary | ICD-10-CM | POA: Diagnosis not present

## 2018-04-24 DIAGNOSIS — I5022 Chronic systolic (congestive) heart failure: Secondary | ICD-10-CM | POA: Diagnosis not present

## 2018-04-24 DIAGNOSIS — E1100 Type 2 diabetes mellitus with hyperosmolarity without nonketotic hyperglycemic-hyperosmolar coma (NKHHC): Secondary | ICD-10-CM | POA: Diagnosis not present

## 2018-04-29 DIAGNOSIS — N189 Chronic kidney disease, unspecified: Secondary | ICD-10-CM | POA: Diagnosis not present

## 2018-04-29 DIAGNOSIS — I10 Essential (primary) hypertension: Secondary | ICD-10-CM | POA: Diagnosis not present

## 2018-04-29 DIAGNOSIS — J189 Pneumonia, unspecified organism: Secondary | ICD-10-CM | POA: Diagnosis not present

## 2018-04-29 DIAGNOSIS — E1100 Type 2 diabetes mellitus with hyperosmolarity without nonketotic hyperglycemic-hyperosmolar coma (NKHHC): Secondary | ICD-10-CM | POA: Diagnosis not present

## 2018-04-30 DIAGNOSIS — J181 Lobar pneumonia, unspecified organism: Secondary | ICD-10-CM | POA: Diagnosis not present

## 2018-04-30 DIAGNOSIS — I130 Hypertensive heart and chronic kidney disease with heart failure and stage 1 through stage 4 chronic kidney disease, or unspecified chronic kidney disease: Secondary | ICD-10-CM | POA: Diagnosis not present

## 2018-04-30 DIAGNOSIS — J9601 Acute respiratory failure with hypoxia: Secondary | ICD-10-CM | POA: Diagnosis not present

## 2018-04-30 DIAGNOSIS — J440 Chronic obstructive pulmonary disease with acute lower respiratory infection: Secondary | ICD-10-CM | POA: Diagnosis not present

## 2018-05-01 DIAGNOSIS — J189 Pneumonia, unspecified organism: Secondary | ICD-10-CM | POA: Diagnosis not present

## 2018-05-01 DIAGNOSIS — I5022 Chronic systolic (congestive) heart failure: Secondary | ICD-10-CM | POA: Diagnosis not present

## 2018-05-01 DIAGNOSIS — I10 Essential (primary) hypertension: Secondary | ICD-10-CM | POA: Diagnosis not present

## 2018-05-01 DIAGNOSIS — I482 Chronic atrial fibrillation, unspecified: Secondary | ICD-10-CM | POA: Diagnosis not present

## 2018-05-02 DIAGNOSIS — E1100 Type 2 diabetes mellitus with hyperosmolarity without nonketotic hyperglycemic-hyperosmolar coma (NKHHC): Secondary | ICD-10-CM | POA: Diagnosis not present

## 2018-05-02 DIAGNOSIS — I482 Chronic atrial fibrillation, unspecified: Secondary | ICD-10-CM | POA: Diagnosis not present

## 2018-05-02 DIAGNOSIS — J441 Chronic obstructive pulmonary disease with (acute) exacerbation: Secondary | ICD-10-CM | POA: Diagnosis not present

## 2018-05-02 DIAGNOSIS — I5022 Chronic systolic (congestive) heart failure: Secondary | ICD-10-CM | POA: Diagnosis not present

## 2018-05-06 DIAGNOSIS — E1100 Type 2 diabetes mellitus with hyperosmolarity without nonketotic hyperglycemic-hyperosmolar coma (NKHHC): Secondary | ICD-10-CM | POA: Diagnosis not present

## 2018-05-06 DIAGNOSIS — F419 Anxiety disorder, unspecified: Secondary | ICD-10-CM | POA: Diagnosis not present

## 2018-05-06 DIAGNOSIS — I482 Chronic atrial fibrillation, unspecified: Secondary | ICD-10-CM | POA: Diagnosis not present

## 2018-05-06 DIAGNOSIS — I5022 Chronic systolic (congestive) heart failure: Secondary | ICD-10-CM | POA: Diagnosis not present

## 2018-05-13 DIAGNOSIS — I482 Chronic atrial fibrillation, unspecified: Secondary | ICD-10-CM | POA: Diagnosis not present

## 2018-05-13 DIAGNOSIS — E1100 Type 2 diabetes mellitus with hyperosmolarity without nonketotic hyperglycemic-hyperosmolar coma (NKHHC): Secondary | ICD-10-CM | POA: Diagnosis not present

## 2018-05-13 DIAGNOSIS — F419 Anxiety disorder, unspecified: Secondary | ICD-10-CM | POA: Diagnosis not present

## 2018-05-13 DIAGNOSIS — I5022 Chronic systolic (congestive) heart failure: Secondary | ICD-10-CM | POA: Diagnosis not present

## 2018-05-15 DIAGNOSIS — I255 Ischemic cardiomyopathy: Secondary | ICD-10-CM | POA: Diagnosis not present

## 2018-05-15 DIAGNOSIS — Z794 Long term (current) use of insulin: Secondary | ICD-10-CM | POA: Diagnosis not present

## 2018-05-15 DIAGNOSIS — E1122 Type 2 diabetes mellitus with diabetic chronic kidney disease: Secondary | ICD-10-CM | POA: Diagnosis not present

## 2018-05-15 DIAGNOSIS — I5022 Chronic systolic (congestive) heart failure: Secondary | ICD-10-CM | POA: Diagnosis not present

## 2018-05-15 DIAGNOSIS — N183 Chronic kidney disease, stage 3 (moderate): Secondary | ICD-10-CM | POA: Diagnosis not present

## 2018-05-15 DIAGNOSIS — Z87440 Personal history of urinary (tract) infections: Secondary | ICD-10-CM | POA: Diagnosis not present

## 2018-05-15 DIAGNOSIS — F419 Anxiety disorder, unspecified: Secondary | ICD-10-CM | POA: Diagnosis not present

## 2018-05-15 DIAGNOSIS — H9190 Unspecified hearing loss, unspecified ear: Secondary | ICD-10-CM | POA: Diagnosis not present

## 2018-05-15 DIAGNOSIS — I110 Hypertensive heart disease with heart failure: Secondary | ICD-10-CM | POA: Diagnosis not present

## 2018-05-15 DIAGNOSIS — Z86718 Personal history of other venous thrombosis and embolism: Secondary | ICD-10-CM | POA: Diagnosis not present

## 2018-05-15 DIAGNOSIS — I482 Chronic atrial fibrillation, unspecified: Secondary | ICD-10-CM | POA: Diagnosis not present

## 2018-05-15 DIAGNOSIS — J449 Chronic obstructive pulmonary disease, unspecified: Secondary | ICD-10-CM | POA: Diagnosis not present

## 2018-05-15 DIAGNOSIS — F0390 Unspecified dementia without behavioral disturbance: Secondary | ICD-10-CM | POA: Diagnosis not present

## 2018-05-16 DIAGNOSIS — I110 Hypertensive heart disease with heart failure: Secondary | ICD-10-CM | POA: Diagnosis not present

## 2018-05-16 DIAGNOSIS — I482 Chronic atrial fibrillation, unspecified: Secondary | ICD-10-CM | POA: Diagnosis not present

## 2018-05-16 DIAGNOSIS — E1122 Type 2 diabetes mellitus with diabetic chronic kidney disease: Secondary | ICD-10-CM | POA: Diagnosis not present

## 2018-05-16 DIAGNOSIS — F419 Anxiety disorder, unspecified: Secondary | ICD-10-CM | POA: Diagnosis not present

## 2018-05-16 DIAGNOSIS — I5022 Chronic systolic (congestive) heart failure: Secondary | ICD-10-CM | POA: Diagnosis not present

## 2018-05-16 DIAGNOSIS — N183 Chronic kidney disease, stage 3 (moderate): Secondary | ICD-10-CM | POA: Diagnosis not present

## 2018-05-18 DIAGNOSIS — I482 Chronic atrial fibrillation, unspecified: Secondary | ICD-10-CM | POA: Diagnosis not present

## 2018-05-18 DIAGNOSIS — E1122 Type 2 diabetes mellitus with diabetic chronic kidney disease: Secondary | ICD-10-CM | POA: Diagnosis not present

## 2018-05-18 DIAGNOSIS — F419 Anxiety disorder, unspecified: Secondary | ICD-10-CM | POA: Diagnosis not present

## 2018-05-18 DIAGNOSIS — I110 Hypertensive heart disease with heart failure: Secondary | ICD-10-CM | POA: Diagnosis not present

## 2018-05-18 DIAGNOSIS — I5022 Chronic systolic (congestive) heart failure: Secondary | ICD-10-CM | POA: Diagnosis not present

## 2018-05-18 DIAGNOSIS — N183 Chronic kidney disease, stage 3 (moderate): Secondary | ICD-10-CM | POA: Diagnosis not present

## 2018-05-20 DIAGNOSIS — I482 Chronic atrial fibrillation, unspecified: Secondary | ICD-10-CM | POA: Diagnosis not present

## 2018-05-20 DIAGNOSIS — E1122 Type 2 diabetes mellitus with diabetic chronic kidney disease: Secondary | ICD-10-CM | POA: Diagnosis not present

## 2018-05-20 DIAGNOSIS — N183 Chronic kidney disease, stage 3 (moderate): Secondary | ICD-10-CM | POA: Diagnosis not present

## 2018-05-20 DIAGNOSIS — I5022 Chronic systolic (congestive) heart failure: Secondary | ICD-10-CM | POA: Diagnosis not present

## 2018-05-20 DIAGNOSIS — I110 Hypertensive heart disease with heart failure: Secondary | ICD-10-CM | POA: Diagnosis not present

## 2018-05-20 DIAGNOSIS — F419 Anxiety disorder, unspecified: Secondary | ICD-10-CM | POA: Diagnosis not present

## 2018-05-21 DIAGNOSIS — Z87891 Personal history of nicotine dependence: Secondary | ICD-10-CM | POA: Diagnosis not present

## 2018-05-21 DIAGNOSIS — I10 Essential (primary) hypertension: Secondary | ICD-10-CM | POA: Diagnosis not present

## 2018-05-21 DIAGNOSIS — I5022 Chronic systolic (congestive) heart failure: Secondary | ICD-10-CM | POA: Diagnosis not present

## 2018-05-21 DIAGNOSIS — I4891 Unspecified atrial fibrillation: Secondary | ICD-10-CM | POA: Diagnosis not present

## 2018-05-21 DIAGNOSIS — N183 Chronic kidney disease, stage 3 (moderate): Secondary | ICD-10-CM | POA: Diagnosis not present

## 2018-05-21 DIAGNOSIS — E1122 Type 2 diabetes mellitus with diabetic chronic kidney disease: Secondary | ICD-10-CM | POA: Diagnosis not present

## 2018-05-21 DIAGNOSIS — F419 Anxiety disorder, unspecified: Secondary | ICD-10-CM | POA: Diagnosis not present

## 2018-05-21 DIAGNOSIS — Z299 Encounter for prophylactic measures, unspecified: Secondary | ICD-10-CM | POA: Diagnosis not present

## 2018-05-21 DIAGNOSIS — I482 Chronic atrial fibrillation, unspecified: Secondary | ICD-10-CM | POA: Diagnosis not present

## 2018-05-21 DIAGNOSIS — J449 Chronic obstructive pulmonary disease, unspecified: Secondary | ICD-10-CM | POA: Diagnosis not present

## 2018-05-21 DIAGNOSIS — I110 Hypertensive heart disease with heart failure: Secondary | ICD-10-CM | POA: Diagnosis not present

## 2018-05-21 DIAGNOSIS — E1165 Type 2 diabetes mellitus with hyperglycemia: Secondary | ICD-10-CM | POA: Diagnosis not present

## 2018-05-21 DIAGNOSIS — Z6841 Body Mass Index (BMI) 40.0 and over, adult: Secondary | ICD-10-CM | POA: Diagnosis not present

## 2018-05-22 DIAGNOSIS — I482 Chronic atrial fibrillation, unspecified: Secondary | ICD-10-CM | POA: Diagnosis not present

## 2018-05-22 DIAGNOSIS — F419 Anxiety disorder, unspecified: Secondary | ICD-10-CM | POA: Diagnosis not present

## 2018-05-22 DIAGNOSIS — E1122 Type 2 diabetes mellitus with diabetic chronic kidney disease: Secondary | ICD-10-CM | POA: Diagnosis not present

## 2018-05-22 DIAGNOSIS — I110 Hypertensive heart disease with heart failure: Secondary | ICD-10-CM | POA: Diagnosis not present

## 2018-05-22 DIAGNOSIS — I5022 Chronic systolic (congestive) heart failure: Secondary | ICD-10-CM | POA: Diagnosis not present

## 2018-05-22 DIAGNOSIS — N183 Chronic kidney disease, stage 3 (moderate): Secondary | ICD-10-CM | POA: Diagnosis not present

## 2018-05-23 DIAGNOSIS — E1122 Type 2 diabetes mellitus with diabetic chronic kidney disease: Secondary | ICD-10-CM | POA: Diagnosis not present

## 2018-05-23 DIAGNOSIS — I110 Hypertensive heart disease with heart failure: Secondary | ICD-10-CM | POA: Diagnosis not present

## 2018-05-23 DIAGNOSIS — I482 Chronic atrial fibrillation, unspecified: Secondary | ICD-10-CM | POA: Diagnosis not present

## 2018-05-23 DIAGNOSIS — F419 Anxiety disorder, unspecified: Secondary | ICD-10-CM | POA: Diagnosis not present

## 2018-05-23 DIAGNOSIS — N183 Chronic kidney disease, stage 3 (moderate): Secondary | ICD-10-CM | POA: Diagnosis not present

## 2018-05-23 DIAGNOSIS — I5022 Chronic systolic (congestive) heart failure: Secondary | ICD-10-CM | POA: Diagnosis not present

## 2018-05-28 DIAGNOSIS — E1122 Type 2 diabetes mellitus with diabetic chronic kidney disease: Secondary | ICD-10-CM | POA: Diagnosis not present

## 2018-05-28 DIAGNOSIS — I5022 Chronic systolic (congestive) heart failure: Secondary | ICD-10-CM | POA: Diagnosis not present

## 2018-05-28 DIAGNOSIS — I110 Hypertensive heart disease with heart failure: Secondary | ICD-10-CM | POA: Diagnosis not present

## 2018-05-28 DIAGNOSIS — F419 Anxiety disorder, unspecified: Secondary | ICD-10-CM | POA: Diagnosis not present

## 2018-05-28 DIAGNOSIS — I482 Chronic atrial fibrillation, unspecified: Secondary | ICD-10-CM | POA: Diagnosis not present

## 2018-05-28 DIAGNOSIS — N183 Chronic kidney disease, stage 3 (moderate): Secondary | ICD-10-CM | POA: Diagnosis not present

## 2018-05-30 DIAGNOSIS — I5022 Chronic systolic (congestive) heart failure: Secondary | ICD-10-CM | POA: Diagnosis not present

## 2018-05-30 DIAGNOSIS — E1122 Type 2 diabetes mellitus with diabetic chronic kidney disease: Secondary | ICD-10-CM | POA: Diagnosis not present

## 2018-05-30 DIAGNOSIS — I110 Hypertensive heart disease with heart failure: Secondary | ICD-10-CM | POA: Diagnosis not present

## 2018-05-30 DIAGNOSIS — F419 Anxiety disorder, unspecified: Secondary | ICD-10-CM | POA: Diagnosis not present

## 2018-05-30 DIAGNOSIS — N183 Chronic kidney disease, stage 3 (moderate): Secondary | ICD-10-CM | POA: Diagnosis not present

## 2018-05-30 DIAGNOSIS — I482 Chronic atrial fibrillation, unspecified: Secondary | ICD-10-CM | POA: Diagnosis not present

## 2018-05-31 DIAGNOSIS — R197 Diarrhea, unspecified: Secondary | ICD-10-CM | POA: Diagnosis not present

## 2018-05-31 DIAGNOSIS — R454 Irritability and anger: Secondary | ICD-10-CM | POA: Diagnosis not present

## 2018-06-04 DIAGNOSIS — I110 Hypertensive heart disease with heart failure: Secondary | ICD-10-CM | POA: Diagnosis not present

## 2018-06-04 DIAGNOSIS — I482 Chronic atrial fibrillation, unspecified: Secondary | ICD-10-CM | POA: Diagnosis not present

## 2018-06-04 DIAGNOSIS — E1122 Type 2 diabetes mellitus with diabetic chronic kidney disease: Secondary | ICD-10-CM | POA: Diagnosis not present

## 2018-06-04 DIAGNOSIS — F419 Anxiety disorder, unspecified: Secondary | ICD-10-CM | POA: Diagnosis not present

## 2018-06-04 DIAGNOSIS — I5022 Chronic systolic (congestive) heart failure: Secondary | ICD-10-CM | POA: Diagnosis not present

## 2018-06-04 DIAGNOSIS — N183 Chronic kidney disease, stage 3 (moderate): Secondary | ICD-10-CM | POA: Diagnosis not present

## 2018-06-11 DIAGNOSIS — E1122 Type 2 diabetes mellitus with diabetic chronic kidney disease: Secondary | ICD-10-CM | POA: Diagnosis not present

## 2018-06-11 DIAGNOSIS — I5022 Chronic systolic (congestive) heart failure: Secondary | ICD-10-CM | POA: Diagnosis not present

## 2018-06-11 DIAGNOSIS — N183 Chronic kidney disease, stage 3 (moderate): Secondary | ICD-10-CM | POA: Diagnosis not present

## 2018-06-11 DIAGNOSIS — F419 Anxiety disorder, unspecified: Secondary | ICD-10-CM | POA: Diagnosis not present

## 2018-06-11 DIAGNOSIS — I482 Chronic atrial fibrillation, unspecified: Secondary | ICD-10-CM | POA: Diagnosis not present

## 2018-06-11 DIAGNOSIS — I110 Hypertensive heart disease with heart failure: Secondary | ICD-10-CM | POA: Diagnosis not present

## 2018-06-14 DIAGNOSIS — Z86718 Personal history of other venous thrombosis and embolism: Secondary | ICD-10-CM | POA: Diagnosis not present

## 2018-06-14 DIAGNOSIS — H9190 Unspecified hearing loss, unspecified ear: Secondary | ICD-10-CM | POA: Diagnosis not present

## 2018-06-14 DIAGNOSIS — J449 Chronic obstructive pulmonary disease, unspecified: Secondary | ICD-10-CM | POA: Diagnosis not present

## 2018-06-14 DIAGNOSIS — N183 Chronic kidney disease, stage 3 (moderate): Secondary | ICD-10-CM | POA: Diagnosis not present

## 2018-06-14 DIAGNOSIS — I5022 Chronic systolic (congestive) heart failure: Secondary | ICD-10-CM | POA: Diagnosis not present

## 2018-06-14 DIAGNOSIS — I482 Chronic atrial fibrillation, unspecified: Secondary | ICD-10-CM | POA: Diagnosis not present

## 2018-06-14 DIAGNOSIS — F419 Anxiety disorder, unspecified: Secondary | ICD-10-CM | POA: Diagnosis not present

## 2018-06-14 DIAGNOSIS — I255 Ischemic cardiomyopathy: Secondary | ICD-10-CM | POA: Diagnosis not present

## 2018-06-14 DIAGNOSIS — Z87440 Personal history of urinary (tract) infections: Secondary | ICD-10-CM | POA: Diagnosis not present

## 2018-06-14 DIAGNOSIS — F0390 Unspecified dementia without behavioral disturbance: Secondary | ICD-10-CM | POA: Diagnosis not present

## 2018-06-14 DIAGNOSIS — E1122 Type 2 diabetes mellitus with diabetic chronic kidney disease: Secondary | ICD-10-CM | POA: Diagnosis not present

## 2018-06-14 DIAGNOSIS — Z794 Long term (current) use of insulin: Secondary | ICD-10-CM | POA: Diagnosis not present

## 2018-06-14 DIAGNOSIS — I110 Hypertensive heart disease with heart failure: Secondary | ICD-10-CM | POA: Diagnosis not present

## 2018-06-19 DIAGNOSIS — E1165 Type 2 diabetes mellitus with hyperglycemia: Secondary | ICD-10-CM | POA: Diagnosis not present

## 2018-06-19 DIAGNOSIS — N183 Chronic kidney disease, stage 3 (moderate): Secondary | ICD-10-CM | POA: Diagnosis not present

## 2018-06-19 DIAGNOSIS — I482 Chronic atrial fibrillation, unspecified: Secondary | ICD-10-CM | POA: Diagnosis not present

## 2018-06-19 DIAGNOSIS — Z6832 Body mass index (BMI) 32.0-32.9, adult: Secondary | ICD-10-CM | POA: Diagnosis not present

## 2018-06-19 DIAGNOSIS — Z299 Encounter for prophylactic measures, unspecified: Secondary | ICD-10-CM | POA: Diagnosis not present

## 2018-06-19 DIAGNOSIS — I110 Hypertensive heart disease with heart failure: Secondary | ICD-10-CM | POA: Diagnosis not present

## 2018-06-19 DIAGNOSIS — I4891 Unspecified atrial fibrillation: Secondary | ICD-10-CM | POA: Diagnosis not present

## 2018-06-19 DIAGNOSIS — I5022 Chronic systolic (congestive) heart failure: Secondary | ICD-10-CM | POA: Diagnosis not present

## 2018-06-19 DIAGNOSIS — I10 Essential (primary) hypertension: Secondary | ICD-10-CM | POA: Diagnosis not present

## 2018-06-19 DIAGNOSIS — E1122 Type 2 diabetes mellitus with diabetic chronic kidney disease: Secondary | ICD-10-CM | POA: Diagnosis not present

## 2018-06-19 DIAGNOSIS — F419 Anxiety disorder, unspecified: Secondary | ICD-10-CM | POA: Diagnosis not present

## 2018-06-21 DIAGNOSIS — I4891 Unspecified atrial fibrillation: Secondary | ICD-10-CM | POA: Diagnosis not present

## 2018-06-21 DIAGNOSIS — J449 Chronic obstructive pulmonary disease, unspecified: Secondary | ICD-10-CM | POA: Diagnosis not present

## 2018-06-21 DIAGNOSIS — E119 Type 2 diabetes mellitus without complications: Secondary | ICD-10-CM | POA: Diagnosis not present

## 2018-06-24 DIAGNOSIS — I482 Chronic atrial fibrillation, unspecified: Secondary | ICD-10-CM | POA: Diagnosis not present

## 2018-06-24 DIAGNOSIS — F419 Anxiety disorder, unspecified: Secondary | ICD-10-CM | POA: Diagnosis not present

## 2018-06-24 DIAGNOSIS — N183 Chronic kidney disease, stage 3 (moderate): Secondary | ICD-10-CM | POA: Diagnosis not present

## 2018-06-24 DIAGNOSIS — I5022 Chronic systolic (congestive) heart failure: Secondary | ICD-10-CM | POA: Diagnosis not present

## 2018-06-24 DIAGNOSIS — I110 Hypertensive heart disease with heart failure: Secondary | ICD-10-CM | POA: Diagnosis not present

## 2018-06-24 DIAGNOSIS — E1122 Type 2 diabetes mellitus with diabetic chronic kidney disease: Secondary | ICD-10-CM | POA: Diagnosis not present

## 2018-07-03 DIAGNOSIS — N183 Chronic kidney disease, stage 3 (moderate): Secondary | ICD-10-CM | POA: Diagnosis not present

## 2018-07-03 DIAGNOSIS — I482 Chronic atrial fibrillation, unspecified: Secondary | ICD-10-CM | POA: Diagnosis not present

## 2018-07-03 DIAGNOSIS — E1122 Type 2 diabetes mellitus with diabetic chronic kidney disease: Secondary | ICD-10-CM | POA: Diagnosis not present

## 2018-07-03 DIAGNOSIS — I5022 Chronic systolic (congestive) heart failure: Secondary | ICD-10-CM | POA: Diagnosis not present

## 2018-07-03 DIAGNOSIS — I110 Hypertensive heart disease with heart failure: Secondary | ICD-10-CM | POA: Diagnosis not present

## 2018-07-03 DIAGNOSIS — F419 Anxiety disorder, unspecified: Secondary | ICD-10-CM | POA: Diagnosis not present

## 2018-07-10 DIAGNOSIS — I110 Hypertensive heart disease with heart failure: Secondary | ICD-10-CM | POA: Diagnosis not present

## 2018-07-10 DIAGNOSIS — I482 Chronic atrial fibrillation, unspecified: Secondary | ICD-10-CM | POA: Diagnosis not present

## 2018-07-10 DIAGNOSIS — N183 Chronic kidney disease, stage 3 (moderate): Secondary | ICD-10-CM | POA: Diagnosis not present

## 2018-07-10 DIAGNOSIS — I5022 Chronic systolic (congestive) heart failure: Secondary | ICD-10-CM | POA: Diagnosis not present

## 2018-07-10 DIAGNOSIS — F419 Anxiety disorder, unspecified: Secondary | ICD-10-CM | POA: Diagnosis not present

## 2018-07-10 DIAGNOSIS — E1122 Type 2 diabetes mellitus with diabetic chronic kidney disease: Secondary | ICD-10-CM | POA: Diagnosis not present

## 2018-07-12 DIAGNOSIS — I110 Hypertensive heart disease with heart failure: Secondary | ICD-10-CM | POA: Diagnosis not present

## 2018-07-12 DIAGNOSIS — I5022 Chronic systolic (congestive) heart failure: Secondary | ICD-10-CM | POA: Diagnosis not present

## 2018-07-12 DIAGNOSIS — N183 Chronic kidney disease, stage 3 (moderate): Secondary | ICD-10-CM | POA: Diagnosis not present

## 2018-07-12 DIAGNOSIS — E1122 Type 2 diabetes mellitus with diabetic chronic kidney disease: Secondary | ICD-10-CM | POA: Diagnosis not present

## 2018-08-27 DIAGNOSIS — N184 Chronic kidney disease, stage 4 (severe): Secondary | ICD-10-CM | POA: Diagnosis not present

## 2018-08-27 DIAGNOSIS — Z6832 Body mass index (BMI) 32.0-32.9, adult: Secondary | ICD-10-CM | POA: Diagnosis not present

## 2018-08-27 DIAGNOSIS — E1122 Type 2 diabetes mellitus with diabetic chronic kidney disease: Secondary | ICD-10-CM | POA: Diagnosis not present

## 2018-08-27 DIAGNOSIS — Z299 Encounter for prophylactic measures, unspecified: Secondary | ICD-10-CM | POA: Diagnosis not present

## 2018-08-27 DIAGNOSIS — E1165 Type 2 diabetes mellitus with hyperglycemia: Secondary | ICD-10-CM | POA: Diagnosis not present

## 2018-08-27 DIAGNOSIS — I10 Essential (primary) hypertension: Secondary | ICD-10-CM | POA: Diagnosis not present

## 2018-10-10 DIAGNOSIS — I4891 Unspecified atrial fibrillation: Secondary | ICD-10-CM | POA: Diagnosis not present

## 2018-10-10 DIAGNOSIS — J449 Chronic obstructive pulmonary disease, unspecified: Secondary | ICD-10-CM | POA: Diagnosis not present

## 2018-10-10 DIAGNOSIS — E119 Type 2 diabetes mellitus without complications: Secondary | ICD-10-CM | POA: Diagnosis not present

## 2018-12-03 DIAGNOSIS — E1165 Type 2 diabetes mellitus with hyperglycemia: Secondary | ICD-10-CM | POA: Diagnosis not present

## 2018-12-03 DIAGNOSIS — Z1211 Encounter for screening for malignant neoplasm of colon: Secondary | ICD-10-CM | POA: Diagnosis not present

## 2018-12-03 DIAGNOSIS — Z1339 Encounter for screening examination for other mental health and behavioral disorders: Secondary | ICD-10-CM | POA: Diagnosis not present

## 2018-12-03 DIAGNOSIS — E7800 Pure hypercholesterolemia, unspecified: Secondary | ICD-10-CM | POA: Diagnosis not present

## 2018-12-03 DIAGNOSIS — Z6832 Body mass index (BMI) 32.0-32.9, adult: Secondary | ICD-10-CM | POA: Diagnosis not present

## 2018-12-03 DIAGNOSIS — Z299 Encounter for prophylactic measures, unspecified: Secondary | ICD-10-CM | POA: Diagnosis not present

## 2018-12-03 DIAGNOSIS — Z0000 Encounter for general adult medical examination without abnormal findings: Secondary | ICD-10-CM | POA: Diagnosis not present

## 2018-12-03 DIAGNOSIS — Z7189 Other specified counseling: Secondary | ICD-10-CM | POA: Diagnosis not present

## 2018-12-03 DIAGNOSIS — I10 Essential (primary) hypertension: Secondary | ICD-10-CM | POA: Diagnosis not present

## 2018-12-03 DIAGNOSIS — Z1331 Encounter for screening for depression: Secondary | ICD-10-CM | POA: Diagnosis not present

## 2018-12-03 DIAGNOSIS — R5383 Other fatigue: Secondary | ICD-10-CM | POA: Diagnosis not present

## 2018-12-03 DIAGNOSIS — Z79899 Other long term (current) drug therapy: Secondary | ICD-10-CM | POA: Diagnosis not present

## 2019-01-16 DIAGNOSIS — I4891 Unspecified atrial fibrillation: Secondary | ICD-10-CM | POA: Diagnosis not present

## 2019-01-16 DIAGNOSIS — J449 Chronic obstructive pulmonary disease, unspecified: Secondary | ICD-10-CM | POA: Diagnosis not present

## 2019-01-16 DIAGNOSIS — E119 Type 2 diabetes mellitus without complications: Secondary | ICD-10-CM | POA: Diagnosis not present

## 2019-02-12 DIAGNOSIS — J449 Chronic obstructive pulmonary disease, unspecified: Secondary | ICD-10-CM | POA: Diagnosis not present

## 2019-02-12 DIAGNOSIS — E119 Type 2 diabetes mellitus without complications: Secondary | ICD-10-CM | POA: Diagnosis not present

## 2019-02-12 DIAGNOSIS — I4891 Unspecified atrial fibrillation: Secondary | ICD-10-CM | POA: Diagnosis not present

## 2019-03-11 DIAGNOSIS — I4891 Unspecified atrial fibrillation: Secondary | ICD-10-CM | POA: Diagnosis not present

## 2019-03-11 DIAGNOSIS — Z299 Encounter for prophylactic measures, unspecified: Secondary | ICD-10-CM | POA: Diagnosis not present

## 2019-03-11 DIAGNOSIS — I10 Essential (primary) hypertension: Secondary | ICD-10-CM | POA: Diagnosis not present

## 2019-03-11 DIAGNOSIS — Z87891 Personal history of nicotine dependence: Secondary | ICD-10-CM | POA: Diagnosis not present

## 2019-03-11 DIAGNOSIS — E1165 Type 2 diabetes mellitus with hyperglycemia: Secondary | ICD-10-CM | POA: Diagnosis not present

## 2019-03-11 DIAGNOSIS — D6869 Other thrombophilia: Secondary | ICD-10-CM | POA: Diagnosis not present

## 2019-03-11 DIAGNOSIS — Z6832 Body mass index (BMI) 32.0-32.9, adult: Secondary | ICD-10-CM | POA: Diagnosis not present

## 2019-04-21 DIAGNOSIS — I4891 Unspecified atrial fibrillation: Secondary | ICD-10-CM | POA: Diagnosis not present

## 2019-04-21 DIAGNOSIS — J449 Chronic obstructive pulmonary disease, unspecified: Secondary | ICD-10-CM | POA: Diagnosis not present

## 2019-04-21 DIAGNOSIS — E119 Type 2 diabetes mellitus without complications: Secondary | ICD-10-CM | POA: Diagnosis not present

## 2019-06-10 DIAGNOSIS — Z6832 Body mass index (BMI) 32.0-32.9, adult: Secondary | ICD-10-CM | POA: Diagnosis not present

## 2019-06-10 DIAGNOSIS — E1165 Type 2 diabetes mellitus with hyperglycemia: Secondary | ICD-10-CM | POA: Diagnosis not present

## 2019-06-10 DIAGNOSIS — Z299 Encounter for prophylactic measures, unspecified: Secondary | ICD-10-CM | POA: Diagnosis not present

## 2019-06-10 DIAGNOSIS — I4891 Unspecified atrial fibrillation: Secondary | ICD-10-CM | POA: Diagnosis not present

## 2019-06-10 DIAGNOSIS — I10 Essential (primary) hypertension: Secondary | ICD-10-CM | POA: Diagnosis not present

## 2019-06-10 DIAGNOSIS — L0390 Cellulitis, unspecified: Secondary | ICD-10-CM | POA: Diagnosis not present

## 2019-06-13 DIAGNOSIS — E1165 Type 2 diabetes mellitus with hyperglycemia: Secondary | ICD-10-CM | POA: Diagnosis not present

## 2019-06-13 DIAGNOSIS — Z299 Encounter for prophylactic measures, unspecified: Secondary | ICD-10-CM | POA: Diagnosis not present

## 2019-06-13 DIAGNOSIS — L0390 Cellulitis, unspecified: Secondary | ICD-10-CM | POA: Diagnosis not present

## 2019-06-13 DIAGNOSIS — I10 Essential (primary) hypertension: Secondary | ICD-10-CM | POA: Diagnosis not present

## 2019-06-15 DIAGNOSIS — I4891 Unspecified atrial fibrillation: Secondary | ICD-10-CM | POA: Diagnosis not present

## 2019-06-15 DIAGNOSIS — J449 Chronic obstructive pulmonary disease, unspecified: Secondary | ICD-10-CM | POA: Diagnosis not present

## 2019-06-15 DIAGNOSIS — E119 Type 2 diabetes mellitus without complications: Secondary | ICD-10-CM | POA: Diagnosis not present

## 2019-07-16 DIAGNOSIS — I4891 Unspecified atrial fibrillation: Secondary | ICD-10-CM | POA: Diagnosis not present

## 2019-07-16 DIAGNOSIS — J449 Chronic obstructive pulmonary disease, unspecified: Secondary | ICD-10-CM | POA: Diagnosis not present

## 2019-07-16 DIAGNOSIS — E119 Type 2 diabetes mellitus without complications: Secondary | ICD-10-CM | POA: Diagnosis not present

## 2019-08-15 DIAGNOSIS — J449 Chronic obstructive pulmonary disease, unspecified: Secondary | ICD-10-CM | POA: Diagnosis not present

## 2019-08-15 DIAGNOSIS — E119 Type 2 diabetes mellitus without complications: Secondary | ICD-10-CM | POA: Diagnosis not present

## 2019-08-15 DIAGNOSIS — I4891 Unspecified atrial fibrillation: Secondary | ICD-10-CM | POA: Diagnosis not present

## 2019-09-17 DIAGNOSIS — N184 Chronic kidney disease, stage 4 (severe): Secondary | ICD-10-CM | POA: Diagnosis not present

## 2019-09-17 DIAGNOSIS — Z6832 Body mass index (BMI) 32.0-32.9, adult: Secondary | ICD-10-CM | POA: Diagnosis not present

## 2019-09-17 DIAGNOSIS — E1122 Type 2 diabetes mellitus with diabetic chronic kidney disease: Secondary | ICD-10-CM | POA: Diagnosis not present

## 2019-09-17 DIAGNOSIS — I10 Essential (primary) hypertension: Secondary | ICD-10-CM | POA: Diagnosis not present

## 2019-09-17 DIAGNOSIS — E1165 Type 2 diabetes mellitus with hyperglycemia: Secondary | ICD-10-CM | POA: Diagnosis not present

## 2019-09-17 DIAGNOSIS — I69359 Hemiplegia and hemiparesis following cerebral infarction affecting unspecified side: Secondary | ICD-10-CM | POA: Diagnosis not present

## 2019-09-17 DIAGNOSIS — Z299 Encounter for prophylactic measures, unspecified: Secondary | ICD-10-CM | POA: Diagnosis not present

## 2019-10-16 DIAGNOSIS — E119 Type 2 diabetes mellitus without complications: Secondary | ICD-10-CM | POA: Diagnosis not present

## 2019-10-16 DIAGNOSIS — J449 Chronic obstructive pulmonary disease, unspecified: Secondary | ICD-10-CM | POA: Diagnosis not present

## 2019-10-16 DIAGNOSIS — I4891 Unspecified atrial fibrillation: Secondary | ICD-10-CM | POA: Diagnosis not present

## 2019-11-11 DIAGNOSIS — Z79899 Other long term (current) drug therapy: Secondary | ICD-10-CM | POA: Diagnosis not present

## 2019-11-11 DIAGNOSIS — R5383 Other fatigue: Secondary | ICD-10-CM | POA: Diagnosis not present

## 2019-11-12 DIAGNOSIS — I10 Essential (primary) hypertension: Secondary | ICD-10-CM | POA: Diagnosis not present

## 2019-11-12 DIAGNOSIS — Z299 Encounter for prophylactic measures, unspecified: Secondary | ICD-10-CM | POA: Diagnosis not present

## 2019-11-12 DIAGNOSIS — F0280 Dementia in other diseases classified elsewhere without behavioral disturbance: Secondary | ICD-10-CM | POA: Diagnosis not present

## 2019-11-12 DIAGNOSIS — Z6832 Body mass index (BMI) 32.0-32.9, adult: Secondary | ICD-10-CM | POA: Diagnosis not present

## 2019-11-12 DIAGNOSIS — G309 Alzheimer's disease, unspecified: Secondary | ICD-10-CM | POA: Diagnosis not present

## 2019-11-12 DIAGNOSIS — J449 Chronic obstructive pulmonary disease, unspecified: Secondary | ICD-10-CM | POA: Diagnosis not present

## 2019-11-12 DIAGNOSIS — Z23 Encounter for immunization: Secondary | ICD-10-CM | POA: Diagnosis not present

## 2019-11-14 DIAGNOSIS — I4891 Unspecified atrial fibrillation: Secondary | ICD-10-CM | POA: Diagnosis not present

## 2019-11-14 DIAGNOSIS — J449 Chronic obstructive pulmonary disease, unspecified: Secondary | ICD-10-CM | POA: Diagnosis not present

## 2019-11-14 DIAGNOSIS — E119 Type 2 diabetes mellitus without complications: Secondary | ICD-10-CM | POA: Diagnosis not present

## 2019-12-09 DIAGNOSIS — N401 Enlarged prostate with lower urinary tract symptoms: Secondary | ICD-10-CM | POA: Diagnosis not present

## 2019-12-09 DIAGNOSIS — J441 Chronic obstructive pulmonary disease with (acute) exacerbation: Secondary | ICD-10-CM | POA: Diagnosis not present

## 2019-12-09 DIAGNOSIS — S00212A Abrasion of left eyelid and periocular area, initial encounter: Secondary | ICD-10-CM | POA: Diagnosis not present

## 2019-12-09 DIAGNOSIS — Z9181 History of falling: Secondary | ICD-10-CM | POA: Diagnosis not present

## 2019-12-09 DIAGNOSIS — N184 Chronic kidney disease, stage 4 (severe): Secondary | ICD-10-CM | POA: Diagnosis not present

## 2019-12-09 DIAGNOSIS — I4891 Unspecified atrial fibrillation: Secondary | ICD-10-CM | POA: Diagnosis not present

## 2019-12-09 DIAGNOSIS — M6281 Muscle weakness (generalized): Secondary | ICD-10-CM | POA: Diagnosis not present

## 2019-12-09 DIAGNOSIS — M1A39X1 Chronic gout due to renal impairment, multiple sites, with tophus (tophi): Secondary | ICD-10-CM | POA: Diagnosis not present

## 2019-12-09 DIAGNOSIS — E039 Hypothyroidism, unspecified: Secondary | ICD-10-CM | POA: Diagnosis not present

## 2019-12-09 DIAGNOSIS — M869 Osteomyelitis, unspecified: Secondary | ICD-10-CM | POA: Diagnosis not present

## 2019-12-09 DIAGNOSIS — R531 Weakness: Secondary | ICD-10-CM | POA: Diagnosis not present

## 2019-12-09 DIAGNOSIS — R262 Difficulty in walking, not elsewhere classified: Secondary | ICD-10-CM | POA: Diagnosis not present

## 2019-12-09 DIAGNOSIS — J679 Hypersensitivity pneumonitis due to unspecified organic dust: Secondary | ICD-10-CM | POA: Diagnosis not present

## 2019-12-09 DIAGNOSIS — F339 Major depressive disorder, recurrent, unspecified: Secondary | ICD-10-CM | POA: Diagnosis not present

## 2019-12-09 DIAGNOSIS — R231 Pallor: Secondary | ICD-10-CM | POA: Diagnosis not present

## 2019-12-09 DIAGNOSIS — R600 Localized edema: Secondary | ICD-10-CM | POA: Diagnosis not present

## 2019-12-09 DIAGNOSIS — I10 Essential (primary) hypertension: Secondary | ICD-10-CM | POA: Diagnosis not present

## 2019-12-09 DIAGNOSIS — J449 Chronic obstructive pulmonary disease, unspecified: Secondary | ICD-10-CM | POA: Diagnosis not present

## 2019-12-09 DIAGNOSIS — G300 Alzheimer's disease with early onset: Secondary | ICD-10-CM | POA: Diagnosis not present

## 2019-12-09 DIAGNOSIS — R1310 Dysphagia, unspecified: Secondary | ICD-10-CM | POA: Diagnosis not present

## 2019-12-09 DIAGNOSIS — R2681 Unsteadiness on feet: Secondary | ICD-10-CM | POA: Diagnosis not present

## 2019-12-09 DIAGNOSIS — R410 Disorientation, unspecified: Secondary | ICD-10-CM | POA: Diagnosis not present

## 2019-12-09 DIAGNOSIS — R270 Ataxia, unspecified: Secondary | ICD-10-CM | POA: Diagnosis not present

## 2019-12-09 DIAGNOSIS — I4811 Longstanding persistent atrial fibrillation: Secondary | ICD-10-CM | POA: Diagnosis not present

## 2019-12-09 DIAGNOSIS — G4700 Insomnia, unspecified: Secondary | ICD-10-CM | POA: Diagnosis not present

## 2019-12-09 DIAGNOSIS — E785 Hyperlipidemia, unspecified: Secondary | ICD-10-CM | POA: Diagnosis not present

## 2019-12-09 DIAGNOSIS — E119 Type 2 diabetes mellitus without complications: Secondary | ICD-10-CM | POA: Diagnosis not present

## 2019-12-10 DIAGNOSIS — G300 Alzheimer's disease with early onset: Secondary | ICD-10-CM | POA: Diagnosis not present

## 2019-12-10 DIAGNOSIS — M1A39X1 Chronic gout due to renal impairment, multiple sites, with tophus (tophi): Secondary | ICD-10-CM | POA: Diagnosis not present

## 2019-12-10 DIAGNOSIS — F339 Major depressive disorder, recurrent, unspecified: Secondary | ICD-10-CM | POA: Diagnosis not present

## 2019-12-10 DIAGNOSIS — J449 Chronic obstructive pulmonary disease, unspecified: Secondary | ICD-10-CM | POA: Diagnosis not present

## 2019-12-16 DIAGNOSIS — J441 Chronic obstructive pulmonary disease with (acute) exacerbation: Secondary | ICD-10-CM | POA: Diagnosis not present

## 2019-12-16 DIAGNOSIS — I4811 Longstanding persistent atrial fibrillation: Secondary | ICD-10-CM | POA: Diagnosis not present

## 2019-12-16 DIAGNOSIS — S00212A Abrasion of left eyelid and periocular area, initial encounter: Secondary | ICD-10-CM | POA: Diagnosis not present

## 2019-12-18 DIAGNOSIS — N179 Acute kidney failure, unspecified: Secondary | ICD-10-CM | POA: Diagnosis not present

## 2019-12-18 DIAGNOSIS — I9589 Other hypotension: Secondary | ICD-10-CM | POA: Diagnosis not present

## 2019-12-18 DIAGNOSIS — G9349 Other encephalopathy: Secondary | ICD-10-CM | POA: Diagnosis not present

## 2019-12-18 DIAGNOSIS — R001 Bradycardia, unspecified: Secondary | ICD-10-CM | POA: Diagnosis not present

## 2019-12-18 DIAGNOSIS — T68XXXA Hypothermia, initial encounter: Secondary | ICD-10-CM | POA: Diagnosis not present

## 2019-12-18 DIAGNOSIS — R609 Edema, unspecified: Secondary | ICD-10-CM | POA: Diagnosis not present

## 2019-12-18 DIAGNOSIS — E039 Hypothyroidism, unspecified: Secondary | ICD-10-CM | POA: Diagnosis not present

## 2019-12-18 DIAGNOSIS — E875 Hyperkalemia: Secondary | ICD-10-CM | POA: Diagnosis not present

## 2019-12-18 DIAGNOSIS — N400 Enlarged prostate without lower urinary tract symptoms: Secondary | ICD-10-CM | POA: Diagnosis not present

## 2019-12-18 DIAGNOSIS — E035 Myxedema coma: Secondary | ICD-10-CM | POA: Diagnosis not present

## 2019-12-19 DIAGNOSIS — E875 Hyperkalemia: Secondary | ICD-10-CM | POA: Diagnosis not present

## 2019-12-19 DIAGNOSIS — N400 Enlarged prostate without lower urinary tract symptoms: Secondary | ICD-10-CM | POA: Diagnosis not present

## 2019-12-19 DIAGNOSIS — E039 Hypothyroidism, unspecified: Secondary | ICD-10-CM | POA: Diagnosis not present

## 2019-12-19 DIAGNOSIS — R001 Bradycardia, unspecified: Secondary | ICD-10-CM | POA: Diagnosis not present

## 2019-12-19 DIAGNOSIS — G9349 Other encephalopathy: Secondary | ICD-10-CM | POA: Diagnosis not present

## 2019-12-19 DIAGNOSIS — N179 Acute kidney failure, unspecified: Secondary | ICD-10-CM | POA: Diagnosis not present

## 2019-12-19 DIAGNOSIS — R609 Edema, unspecified: Secondary | ICD-10-CM | POA: Diagnosis not present

## 2019-12-19 DIAGNOSIS — I9589 Other hypotension: Secondary | ICD-10-CM | POA: Diagnosis not present

## 2019-12-19 DIAGNOSIS — T68XXXA Hypothermia, initial encounter: Secondary | ICD-10-CM | POA: Diagnosis not present

## 2019-12-19 DIAGNOSIS — E035 Myxedema coma: Secondary | ICD-10-CM | POA: Diagnosis not present

## 2019-12-20 DIAGNOSIS — N400 Enlarged prostate without lower urinary tract symptoms: Secondary | ICD-10-CM | POA: Diagnosis not present

## 2019-12-20 DIAGNOSIS — N179 Acute kidney failure, unspecified: Secondary | ICD-10-CM | POA: Diagnosis not present

## 2019-12-20 DIAGNOSIS — R001 Bradycardia, unspecified: Secondary | ICD-10-CM | POA: Diagnosis not present

## 2019-12-20 DIAGNOSIS — G9349 Other encephalopathy: Secondary | ICD-10-CM | POA: Diagnosis not present

## 2019-12-20 DIAGNOSIS — E875 Hyperkalemia: Secondary | ICD-10-CM | POA: Diagnosis not present

## 2019-12-20 DIAGNOSIS — E035 Myxedema coma: Secondary | ICD-10-CM | POA: Diagnosis not present

## 2019-12-20 DIAGNOSIS — T68XXXA Hypothermia, initial encounter: Secondary | ICD-10-CM | POA: Diagnosis not present

## 2019-12-20 DIAGNOSIS — I9589 Other hypotension: Secondary | ICD-10-CM | POA: Diagnosis not present

## 2019-12-20 DIAGNOSIS — E039 Hypothyroidism, unspecified: Secondary | ICD-10-CM | POA: Diagnosis not present

## 2019-12-20 DIAGNOSIS — R609 Edema, unspecified: Secondary | ICD-10-CM | POA: Diagnosis not present

## 2019-12-21 DIAGNOSIS — T68XXXA Hypothermia, initial encounter: Secondary | ICD-10-CM | POA: Diagnosis not present

## 2019-12-21 DIAGNOSIS — E035 Myxedema coma: Secondary | ICD-10-CM | POA: Diagnosis not present

## 2019-12-21 DIAGNOSIS — N179 Acute kidney failure, unspecified: Secondary | ICD-10-CM | POA: Diagnosis not present

## 2019-12-21 DIAGNOSIS — G9349 Other encephalopathy: Secondary | ICD-10-CM | POA: Diagnosis not present

## 2019-12-21 DIAGNOSIS — R609 Edema, unspecified: Secondary | ICD-10-CM | POA: Diagnosis not present

## 2019-12-21 DIAGNOSIS — I9589 Other hypotension: Secondary | ICD-10-CM | POA: Diagnosis not present

## 2019-12-21 DIAGNOSIS — R001 Bradycardia, unspecified: Secondary | ICD-10-CM | POA: Diagnosis not present

## 2019-12-21 DIAGNOSIS — N400 Enlarged prostate without lower urinary tract symptoms: Secondary | ICD-10-CM | POA: Diagnosis not present

## 2019-12-21 DIAGNOSIS — E039 Hypothyroidism, unspecified: Secondary | ICD-10-CM | POA: Diagnosis not present

## 2019-12-21 DIAGNOSIS — E875 Hyperkalemia: Secondary | ICD-10-CM | POA: Diagnosis not present

## 2019-12-25 DIAGNOSIS — E039 Hypothyroidism, unspecified: Secondary | ICD-10-CM | POA: Diagnosis not present

## 2019-12-25 DIAGNOSIS — R627 Adult failure to thrive: Secondary | ICD-10-CM | POA: Diagnosis not present

## 2019-12-25 DIAGNOSIS — Z7401 Bed confinement status: Secondary | ICD-10-CM | POA: Diagnosis not present

## 2019-12-25 DIAGNOSIS — Z741 Need for assistance with personal care: Secondary | ICD-10-CM | POA: Diagnosis not present

## 2019-12-25 DIAGNOSIS — J449 Chronic obstructive pulmonary disease, unspecified: Secondary | ICD-10-CM | POA: Diagnosis not present

## 2019-12-25 DIAGNOSIS — N400 Enlarged prostate without lower urinary tract symptoms: Secondary | ICD-10-CM | POA: Diagnosis not present

## 2019-12-25 DIAGNOSIS — E43 Unspecified severe protein-calorie malnutrition: Secondary | ICD-10-CM | POA: Diagnosis not present

## 2019-12-25 DIAGNOSIS — N184 Chronic kidney disease, stage 4 (severe): Secondary | ICD-10-CM | POA: Diagnosis not present

## 2019-12-25 DIAGNOSIS — I4891 Unspecified atrial fibrillation: Secondary | ICD-10-CM | POA: Diagnosis not present

## 2019-12-25 DIAGNOSIS — M109 Gout, unspecified: Secondary | ICD-10-CM | POA: Diagnosis not present

## 2019-12-25 DIAGNOSIS — F0280 Dementia in other diseases classified elsewhere without behavioral disturbance: Secondary | ICD-10-CM | POA: Diagnosis not present

## 2019-12-25 DIAGNOSIS — I2510 Atherosclerotic heart disease of native coronary artery without angina pectoris: Secondary | ICD-10-CM | POA: Diagnosis not present

## 2019-12-25 DIAGNOSIS — E1122 Type 2 diabetes mellitus with diabetic chronic kidney disease: Secondary | ICD-10-CM | POA: Diagnosis not present

## 2019-12-25 DIAGNOSIS — F339 Major depressive disorder, recurrent, unspecified: Secondary | ICD-10-CM | POA: Diagnosis not present

## 2019-12-25 DIAGNOSIS — G301 Alzheimer's disease with late onset: Secondary | ICD-10-CM | POA: Diagnosis not present

## 2019-12-25 DIAGNOSIS — Z743 Need for continuous supervision: Secondary | ICD-10-CM | POA: Diagnosis not present

## 2019-12-25 DIAGNOSIS — I10 Essential (primary) hypertension: Secondary | ICD-10-CM | POA: Diagnosis not present

## 2019-12-25 DIAGNOSIS — R296 Repeated falls: Secondary | ICD-10-CM | POA: Diagnosis not present

## 2019-12-26 DIAGNOSIS — G301 Alzheimer's disease with late onset: Secondary | ICD-10-CM | POA: Diagnosis not present

## 2019-12-26 DIAGNOSIS — I10 Essential (primary) hypertension: Secondary | ICD-10-CM | POA: Diagnosis not present

## 2019-12-26 DIAGNOSIS — I2510 Atherosclerotic heart disease of native coronary artery without angina pectoris: Secondary | ICD-10-CM | POA: Diagnosis not present

## 2019-12-26 DIAGNOSIS — F0280 Dementia in other diseases classified elsewhere without behavioral disturbance: Secondary | ICD-10-CM | POA: Diagnosis not present

## 2019-12-26 DIAGNOSIS — I4891 Unspecified atrial fibrillation: Secondary | ICD-10-CM | POA: Diagnosis not present

## 2019-12-26 DIAGNOSIS — E1122 Type 2 diabetes mellitus with diabetic chronic kidney disease: Secondary | ICD-10-CM | POA: Diagnosis not present

## 2019-12-27 DIAGNOSIS — G301 Alzheimer's disease with late onset: Secondary | ICD-10-CM | POA: Diagnosis not present

## 2019-12-27 DIAGNOSIS — E1122 Type 2 diabetes mellitus with diabetic chronic kidney disease: Secondary | ICD-10-CM | POA: Diagnosis not present

## 2019-12-27 DIAGNOSIS — I2510 Atherosclerotic heart disease of native coronary artery without angina pectoris: Secondary | ICD-10-CM | POA: Diagnosis not present

## 2019-12-27 DIAGNOSIS — F0280 Dementia in other diseases classified elsewhere without behavioral disturbance: Secondary | ICD-10-CM | POA: Diagnosis not present

## 2019-12-27 DIAGNOSIS — I10 Essential (primary) hypertension: Secondary | ICD-10-CM | POA: Diagnosis not present

## 2019-12-27 DIAGNOSIS — I4891 Unspecified atrial fibrillation: Secondary | ICD-10-CM | POA: Diagnosis not present

## 2019-12-28 DIAGNOSIS — I4891 Unspecified atrial fibrillation: Secondary | ICD-10-CM | POA: Diagnosis not present

## 2019-12-28 DIAGNOSIS — E1122 Type 2 diabetes mellitus with diabetic chronic kidney disease: Secondary | ICD-10-CM | POA: Diagnosis not present

## 2019-12-28 DIAGNOSIS — I2510 Atherosclerotic heart disease of native coronary artery without angina pectoris: Secondary | ICD-10-CM | POA: Diagnosis not present

## 2019-12-28 DIAGNOSIS — F0280 Dementia in other diseases classified elsewhere without behavioral disturbance: Secondary | ICD-10-CM | POA: Diagnosis not present

## 2019-12-28 DIAGNOSIS — G301 Alzheimer's disease with late onset: Secondary | ICD-10-CM | POA: Diagnosis not present

## 2019-12-28 DIAGNOSIS — I10 Essential (primary) hypertension: Secondary | ICD-10-CM | POA: Diagnosis not present

## 2019-12-29 DIAGNOSIS — F0280 Dementia in other diseases classified elsewhere without behavioral disturbance: Secondary | ICD-10-CM | POA: Diagnosis not present

## 2019-12-29 DIAGNOSIS — I4891 Unspecified atrial fibrillation: Secondary | ICD-10-CM | POA: Diagnosis not present

## 2019-12-29 DIAGNOSIS — I2510 Atherosclerotic heart disease of native coronary artery without angina pectoris: Secondary | ICD-10-CM | POA: Diagnosis not present

## 2019-12-29 DIAGNOSIS — I10 Essential (primary) hypertension: Secondary | ICD-10-CM | POA: Diagnosis not present

## 2019-12-29 DIAGNOSIS — E1122 Type 2 diabetes mellitus with diabetic chronic kidney disease: Secondary | ICD-10-CM | POA: Diagnosis not present

## 2019-12-29 DIAGNOSIS — G301 Alzheimer's disease with late onset: Secondary | ICD-10-CM | POA: Diagnosis not present

## 2020-01-17 DEATH — deceased

## 2020-04-20 IMAGING — DX DG CHEST 1V PORT
1 series · 1 of 1 positions shown · non-contrast
Comparison: 03/05/2018.

CLINICAL DATA: With congestive heart failure.

EXAM:
PORTABLE CHEST 1 VIEW

[chest]
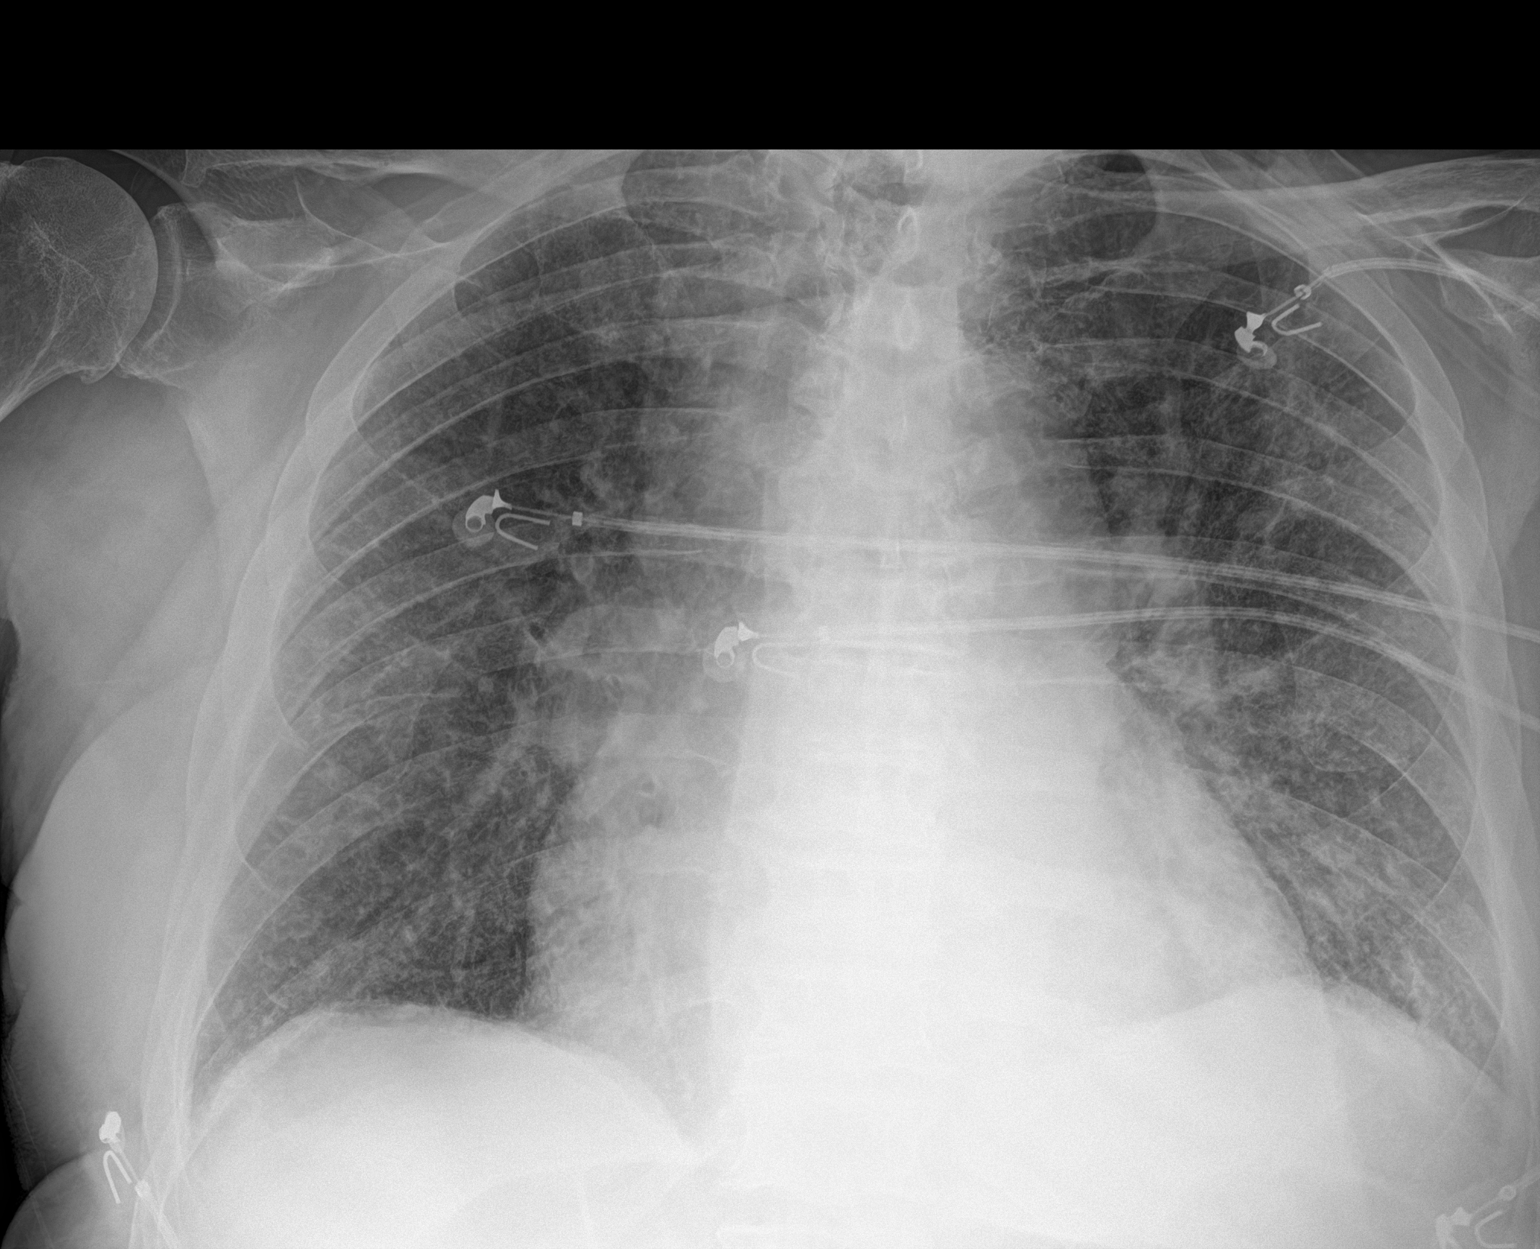

[1 of 1 positions shown; findings below may reference images not displayed]

FINDINGS: Cardiomegaly. Diffuse bilateral from interstitial prominence.
Findings consistent CHF. Slight interim improvement in aeration from
prior exam. Tiny left pleural effusion. No pneumothorax.
IMPRESSION: Cardiomegaly with diffuse bilateral pulmonary interstitial
prominence. Findings suggest CHF. Slight interim improvement in
aeration from prior exam.

## 2020-04-23 IMAGING — DX DG CHEST 1V PORT
1 series · 1 of 1 positions shown · non-contrast
Comparison: One-view chest x-ray 03/15/18

CLINICAL DATA: Respiratory failure.

EXAM:
PORTABLE CHEST 1 VIEW

[chest]
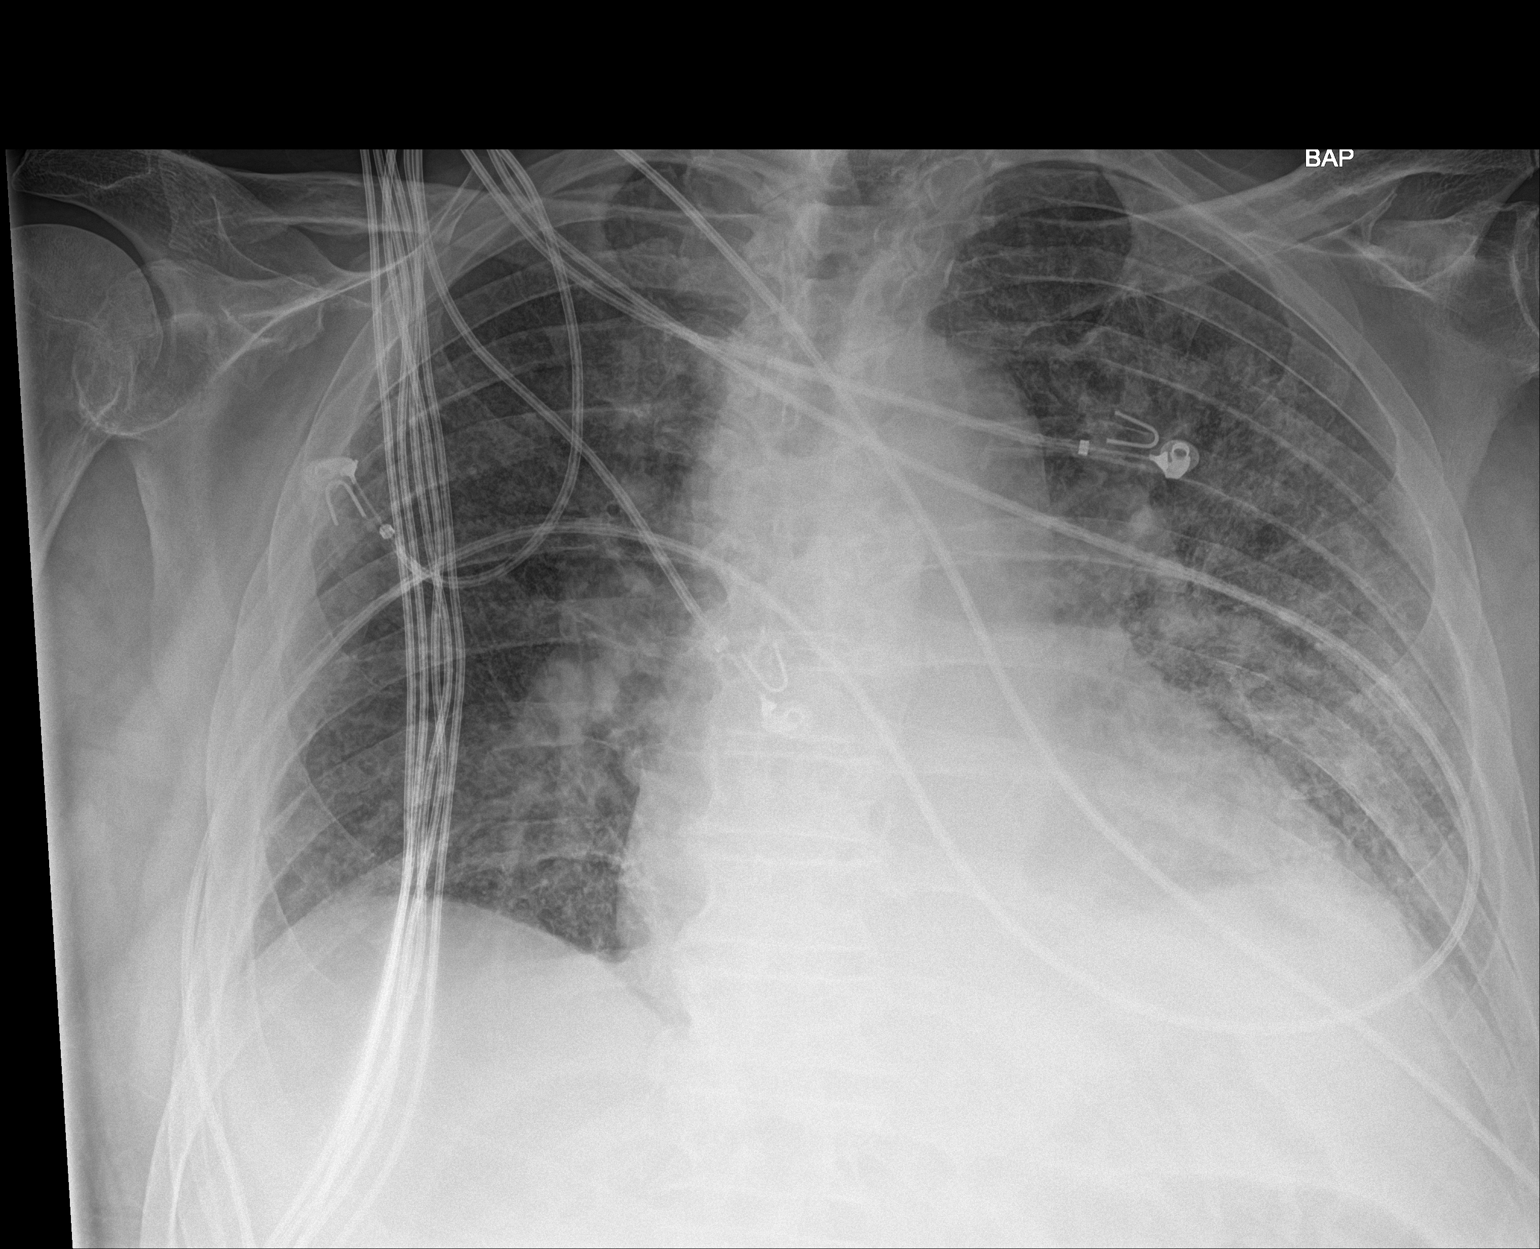

[1 of 1 positions shown; findings below may reference images not displayed]

FINDINGS: Heart is enlarged. Progressive interstitial and airspace disease is
present, left greater than right. Small effusions are suspected. No
significant consolidation is present.
IMPRESSION: 1. Cardiomegaly with increasing interstitial and airspace disease,
left greater than right. This raises concern for developing
pneumonia. Asymmetric edema could have a similar appearance.

## 2020-05-08 IMAGING — DX PORTABLE CHEST - 1 VIEW
1 series · 1 of 1 positions shown · non-contrast
Comparison: 03/18/2018.

CLINICAL DATA: Pneumonia.

EXAM:
PORTABLE CHEST 1 VIEW

[chest]
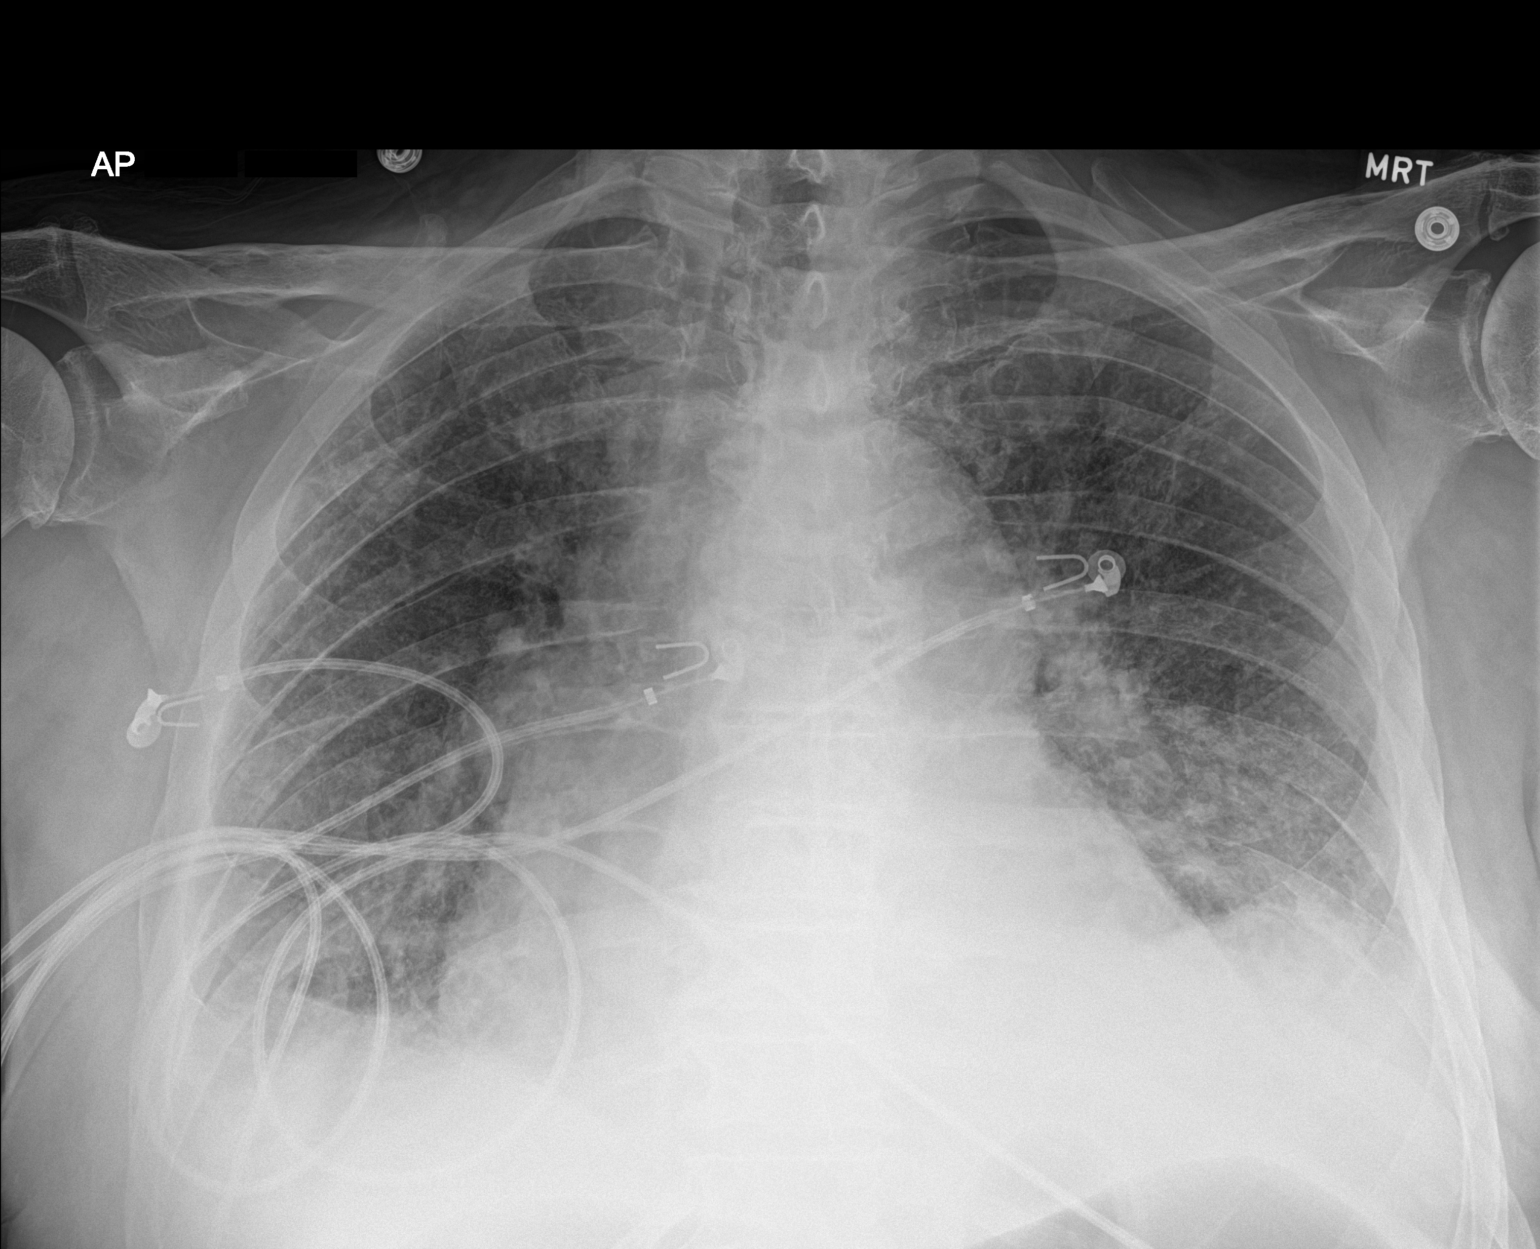

[1 of 1 positions shown; findings below may reference images not displayed]

FINDINGS: Severe cardiomegaly. Diffuse bilateral pulmonary interstitial
prominence. Findings consistent interstitial edema and/or
pneumonitis. Small bilateral pleural effusions can not be excluded.
No pneumothorax.
IMPRESSION: 1.  Severe cardiomegaly.

2. Diffuse bilateral pulmonary interstitial prominence. Findings
consistent with interstitial edema and/or pneumonitis. Small
bilateral pleural effusions can not be excluded. Similar findings
noted on prior exam.
